# Patient Record
Sex: Male | Born: 1955
Health system: Southern US, Community
[De-identification: ages and names within clinical notes are randomized; demographics above are authoritative.]

## PROBLEM LIST (undated history)

## (undated) DIAGNOSIS — M1990 Unspecified osteoarthritis, unspecified site: Secondary | ICD-10-CM

## (undated) HISTORY — DX: Unspecified osteoarthritis, unspecified site: M19.90

## (undated) HISTORY — PX: ELBOW SURGERY: SHX618

---

## 2001-09-12 ENCOUNTER — Emergency Department (HOSPITAL_COMMUNITY): Admission: EM | Admit: 2001-09-12 | Discharge: 2001-09-13 | Payer: Self-pay | Admitting: *Deleted

## 2011-03-15 ENCOUNTER — Ambulatory Visit (INDEPENDENT_AMBULATORY_CARE_PROVIDER_SITE_OTHER): Payer: 59

## 2011-03-15 DIAGNOSIS — Z23 Encounter for immunization: Secondary | ICD-10-CM

## 2011-03-15 DIAGNOSIS — Z0000 Encounter for general adult medical examination without abnormal findings: Secondary | ICD-10-CM

## 2011-03-15 DIAGNOSIS — R7309 Other abnormal glucose: Secondary | ICD-10-CM

## 2012-12-08 ENCOUNTER — Ambulatory Visit (INDEPENDENT_AMBULATORY_CARE_PROVIDER_SITE_OTHER): Payer: BC Managed Care – PPO | Admitting: Internal Medicine

## 2012-12-08 ENCOUNTER — Ambulatory Visit: Payer: BC Managed Care – PPO

## 2012-12-08 VITALS — BP 130/90 | HR 70 | Temp 97.5°F | Resp 18 | Ht 72.0 in | Wt 292.0 lb

## 2012-12-08 DIAGNOSIS — Z0000 Encounter for general adult medical examination without abnormal findings: Secondary | ICD-10-CM

## 2012-12-08 DIAGNOSIS — M7989 Other specified soft tissue disorders: Secondary | ICD-10-CM

## 2012-12-08 DIAGNOSIS — M79641 Pain in right hand: Secondary | ICD-10-CM

## 2012-12-08 DIAGNOSIS — Z23 Encounter for immunization: Secondary | ICD-10-CM

## 2012-12-08 DIAGNOSIS — M79609 Pain in unspecified limb: Secondary | ICD-10-CM

## 2012-12-08 LAB — POCT UA - MICROSCOPIC ONLY
Bacteria, U Microscopic: NEGATIVE
Casts, Ur, LPF, POC: NEGATIVE
Crystals, Ur, HPF, POC: NEGATIVE
Epithelial cells, urine per micros: NEGATIVE
Mucus, UA: NEGATIVE
RBC, urine, microscopic: NEGATIVE
WBC, Ur, HPF, POC: NEGATIVE
Yeast, UA: NEGATIVE

## 2012-12-08 LAB — COMPREHENSIVE METABOLIC PANEL
ALT: 27 U/L (ref 0–53)
AST: 26 U/L (ref 0–37)
Albumin: 4.8 g/dL (ref 3.5–5.2)
Alkaline Phosphatase: 38 U/L — ABNORMAL LOW (ref 39–117)
BUN: 14 mg/dL (ref 6–23)
CO2: 28 mEq/L (ref 19–32)
Calcium: 9.5 mg/dL (ref 8.4–10.5)
Chloride: 105 mEq/L (ref 96–112)
Creat: 0.78 mg/dL (ref 0.50–1.35)
Glucose, Bld: 101 mg/dL — ABNORMAL HIGH (ref 70–99)
Potassium: 4.3 mEq/L (ref 3.5–5.3)
Sodium: 143 mEq/L (ref 135–145)
Total Bilirubin: 0.6 mg/dL (ref 0.3–1.2)
Total Protein: 7.6 g/dL (ref 6.0–8.3)

## 2012-12-08 LAB — TSH: TSH: 2.564 u[IU]/mL (ref 0.350–4.500)

## 2012-12-08 LAB — POCT URINALYSIS DIPSTICK
Bilirubin, UA: NEGATIVE
Blood, UA: NEGATIVE
Glucose, UA: NEGATIVE
Ketones, UA: NEGATIVE
Leukocytes, UA: NEGATIVE
Nitrite, UA: NEGATIVE
Protein, UA: NEGATIVE
Spec Grav, UA: >=1.03
Urobilinogen, UA: 0.2
pH, UA: 5

## 2012-12-08 LAB — POCT CBC
Granulocyte percent: 57.1 %G (ref 37–80)
HCT, POC: 45.1 % (ref 43.5–53.7)
Hemoglobin: 14.7 g/dL (ref 14.1–18.1)
Lymph, poc: 2.5 (ref 0.6–3.4)
MCH, POC: 32.6 pg — AB (ref 27–31.2)
MCHC: 32.6 g/dL (ref 31.8–35.4)
MCV: 99.9 fL — AB (ref 80–97)
MID (cbc): 0.6 (ref 0–0.9)
MPV: 8.4 fL (ref 0–99.8)
POC Granulocyte: 4.1 (ref 2–6.9)
POC LYMPH PERCENT: 34.5 %L (ref 10–50)
POC MID %: 8.4 %M (ref 0–12)
Platelet Count, POC: 263 10*3/uL (ref 142–424)
RBC: 4.51 M/uL — AB (ref 4.69–6.13)
RDW, POC: 13.1 %
WBC: 7.2 10*3/uL (ref 4.6–10.2)

## 2012-12-08 LAB — GLUCOSE, POCT (MANUAL RESULT ENTRY): POC Glucose: 94 mg/dl (ref 70–99)

## 2012-12-08 LAB — URIC ACID: Uric Acid, Serum: 5.6 mg/dL (ref 4.0–7.8)

## 2012-12-08 LAB — POCT GLYCOSYLATED HEMOGLOBIN (HGB A1C): Hemoglobin A1C: 5.4

## 2012-12-08 LAB — PSA: PSA: 0.49 ng/mL (ref <=4.00)

## 2012-12-08 LAB — LIPID PANEL
Cholesterol: 202 mg/dL — ABNORMAL HIGH (ref 0–200)
HDL: 43 mg/dL (ref >39)
LDL Cholesterol: 122 mg/dL — ABNORMAL HIGH (ref 0–99)
Total CHOL/HDL Ratio: 4.7 Ratio
Triglycerides: 185 mg/dL — ABNORMAL HIGH (ref <150)
VLDL: 37 mg/dL (ref 0–40)

## 2012-12-08 MED ORDER — IBUPROFEN 600 MG PO TABS: 600.0000 mg | ORAL_TABLET | Freq: Three times a day (TID) | ORAL | Status: DC | PRN

## 2012-12-08 NOTE — Progress Notes (Signed)
Subjective:    Patient ID: William Leach, male    DOB: 09-Jun-1955, 57 y.o.   MRN: [id number]  HPI Wants CPE, only co tender swollen right hand at 3rd mcpj.  No red or warm. Continues to gain weight and does not exercise. No smoke Hx of normal cardiac stress test and colonoscopy inless than 5 years Review of Systems  Constitutional: Negative.   HENT: Negative.   Eyes: Negative.   Respiratory: Negative.   Cardiovascular: Negative.   Gastrointestinal: Negative.   Endocrine: Negative.   Musculoskeletal: Positive for arthralgias.  Skin: Negative.   Allergic/Immunologic: Negative.   Neurological: Negative.   Hematological: Negative.   Psychiatric/Behavioral: Negative.        Objective:   Physical Exam  Vitals reviewed. Constitutional: He is oriented to person, place, and time. He appears well-developed and well-nourished. No distress.  HENT:  Head: Normocephalic.  Right Ear: External ear normal.  Left Ear: External ear normal.  Nose: Nose normal.  Mouth/Throat: Oropharynx is clear and moist.  Eyes: Conjunctivae and EOM are normal. Pupils are equal, round, and reactive to light. No scleral icterus.  Neck: Normal range of motion. Neck supple. No tracheal deviation present. No thyromegaly present.  Cardiovascular: Normal rate, regular rhythm, normal heart sounds and intact distal pulses.   No murmur heard. Pulmonary/Chest: Effort normal and breath sounds normal.  Abdominal: Soft. Bowel sounds are normal. He exhibits no distension and no mass. There is no tenderness.  Genitourinary: Rectum normal, prostate normal and penis normal.  Musculoskeletal: He exhibits edema and tenderness.  Lymphadenopathy:    He has no cervical adenopathy.  Neurological: He is alert and oriented to person, place, and time. He has normal reflexes. No cranial nerve deficit. He exhibits normal muscle tone. Coordination normal.  Skin: No rash noted. No erythema.  Psychiatric: He has a normal mood and  affect. His behavior is normal. Judgment and thought content normal.   [facility] reading (PRIMARY) by  Dr.Guest cystic djd of mc head, cxr normal EKG ok Results for orders placed in visit on 12/08/12  POCT CBC      Result Value Range   WBC 7.2  4.6 - 10.2 K/uL   Lymph, poc 2.5  0.6 - 3.4   POC LYMPH PERCENT 34.5  10 - 50 %L   MID (cbc) 0.6  0 - 0.9   POC MID % 8.4  0 - 12 %M   POC Granulocyte 4.1  2 - 6.9   Granulocyte percent 57.1  37 - 80 %G   RBC 4.51 (*) 4.69 - 6.13 M/uL   Hemoglobin 14.7  14.1 - 18.1 g/dL   HCT, POC 40.9  81.1 - 53.7 %   MCV 99.9 (*) 80 - 97 fL   MCH, POC 32.6 (*) 27 - 31.2 pg   MCHC 32.6  31.8 - 35.4 g/dL   RDW, POC 91.4     Platelet Count, POC 263  142 - 424 K/uL   MPV 8.4  0 - 99.8 fL  GLUCOSE, POCT (MANUAL RESULT ENTRY)      Result Value Range   POC Glucose 94  70 - 99 mg/dl  POCT GLYCOSYLATED HEMOGLOBIN (HGB A1C)      Result Value Range   Hemoglobin A1C 5.4    POCT URINALYSIS DIPSTICK      Result Value Range   Color, UA yellow     Clarity, UA clear     Glucose, UA neg     Bilirubin, UA neg  Ketones, UA neg     Spec Grav, UA >=1.030     Blood, UA neg     pH, UA 5.0     Protein, UA neg     Urobilinogen, UA 0.2     Nitrite, UA neg     Leukocytes, UA Negative    POCT UA - MICROSCOPIC ONLY      Result Value Range   WBC, Ur, HPF, POC neg     RBC, urine, microscopic neg     Bacteria, U Microscopic neg     Mucus, UA neg     Epithelial cells, urine per micros neg     Crystals, Ur, HPF, POC neg     Casts, Ur, LPF, POC neg     Yeast, UA neg             Assessment & Plan:  Exercise/Lose weight Pneumovax Uric acid level/Motrin 600mg  tid trial ro gout

## 2012-12-08 NOTE — Patient Instructions (Signed)
DASH Diet The DASH diet stands for "Dietary Approaches to Stop Hypertension." It is a healthy eating plan that has been shown to reduce high blood pressure (hypertension) in as little as 14 days, while also possibly providing other significant health benefits. These other health benefits include reducing the risk of breast cancer after menopause and reducing the risk of type 2 diabetes, heart disease, colon cancer, and stroke. Health benefits also include weight loss and slowing kidney failure in patients with chronic kidney disease.  DIET GUIDELINES  Limit salt (sodium). Your diet should contain less than 1500 mg of sodium daily.  Limit refined or processed carbohydrates. Your diet should include mostly whole grains. Desserts and added sugars should be used sparingly.  Include small amounts of heart-healthy fats. These types of fats include nuts, oils, and tub margarine. Limit saturated and trans fats. These fats have been shown to be harmful in the body. CHOOSING FOODS  The following food groups are based on a 2000 calorie diet. See your Registered Dietitian for individual calorie needs. Grains and Grain Products (6 to 8 servings daily)  Eat More Often: Whole-wheat bread, brown rice, whole-grain or wheat pasta, quinoa, popcorn without added fat or salt (air popped).  Eat Less Often: White bread, white pasta, white rice, cornbread. Vegetables (4 to 5 servings daily)  Eat More Often: Fresh, frozen, and canned vegetables. Vegetables may be raw, steamed, roasted, or grilled with a minimal amount of fat.  Eat Less Often/Avoid: Creamed or fried vegetables. Vegetables in a cheese sauce. Fruit (4 to 5 servings daily)  Eat More Often: All fresh, canned (in natural juice), or frozen fruits. Dried fruits without added sugar. One hundred percent fruit juice ( cup [237 mL] daily).  Eat Less Often: Dried fruits with added sugar. Canned fruit in light or heavy syrup. Lean Meats, Fish, and Poultry (2  servings or less daily. One serving is 3 to 4 oz [85-114 g]).  Eat More Often: Ninety percent or leaner ground beef, tenderloin, sirloin. Round cuts of beef, chicken breast, turkey breast. All fish. Grill, bake, or broil your meat. Nothing should be fried.  Eat Less Often/Avoid: Fatty cuts of meat, turkey, or chicken leg, thigh, or wing. Fried cuts of meat or fish. Dairy (2 to 3 servings)  Eat More Often: Low-fat or fat-free milk, low-fat plain or light yogurt, reduced-fat or part-skim cheese.  Eat Less Often/Avoid: Milk (whole, 2%).Whole milk yogurt. Full-fat cheeses. Nuts, Seeds, and Legumes (4 to 5 servings per week)  Eat More Often: All without added salt.  Eat Less Often/Avoid: Salted nuts and seeds, canned beans with added salt. Fats and Sweets (limited)  Eat More Often: Vegetable oils, tub margarines without trans fats, sugar-free gelatin. Mayonnaise and salad dressings.  Eat Less Often/Avoid: Coconut oils, palm oils, butter, stick margarine, cream, half and half, cookies, candy, pie. FOR MORE INFORMATION The Dash Diet Eating Plan: www.dashdiet.org Document Released: 03/14/2011 Document Revised: 06/17/2011 Document Reviewed: 03/14/2011 [employer] Patient Information 2014 [employer]. Calorie Counting Diet A calorie counting diet requires you to eat the number of calories that are right for you in a day. Calories are the measurement of how much energy you get from the food you eat. Eating the right amount of calories is important for staying at a healthy weight. If you eat too many calories, your body will store them as fat and you may gain weight. If you eat too few calories, you may lose weight. Counting the number of calories you eat during   a day will help you know if you are eating the right amount. A Registered Dietitian can determine how many calories you need in a day. The amount of calories needed varies from person to person. If your goal is to lose weight, you will need  to eat fewer calories. Losing weight can benefit you if you are overweight or have health problems such as heart disease, high blood pressure, or diabetes. If your goal is to gain weight, you will need to eat more calories. Gaining weight may be necessary if you have a certain health problem that causes your body to need more energy. TIPS Whether you are increasing or decreasing the number of calories you eat during a day, it may be hard to get used to changes in what you eat and drink. The following are tips to help you keep track of the number of calories you eat.  Measure foods at home with measuring cups. This helps you know the amount of food and number of calories you are eating.  Restaurants often serve food in amounts that are larger than 1 serving. While eating out, estimate how many servings of a food you are given. For example, a serving of cooked rice is  cup or about the size of half of a fist. Knowing serving sizes will help you be aware of how much food you are eating at restaurants.  Ask for smaller portion sizes or child-size portions at restaurants.  Plan to eat half of a meal at a restaurant. Take the rest home or share the other half with a friend.  Read the Nutrition Facts panel on food labels for calorie content and serving size. You can find out how many servings are in a package, the size of a serving, and the number of calories each serving has.  For example, a package might contain 3 cookies. The Nutrition Facts panel on that package says that 1 serving is 1 cookie. Below that, it will say there are 3 servings in the container. The calories section of the Nutrition Facts label says there are 90 calories. This means there are 90 calories in 1 cookie (1 serving). If you eat 1 cookie you have eaten 90 calories. If you eat all 3 cookies, you have eaten 270 calories (3 servings x 90 calories = 270 calories). The list below tells you how big or small some common portion sizes  are.  1 oz.........4 stacked dice.  3 oz.........Deck of cards.  1 tsp........Tip of little finger.  1 tbs........Thumb.  2 tbs........Golf ball.   cup.......Half of a fist.  1 cup........A fist. KEEP A FOOD LOG Write down every food item you eat, the amount you eat, and the number of calories in each food you eat during the day. At the end of the day, you can add up the total number of calories you have eaten. It may help to keep a list like the one below. Find out the calorie information by reading the Nutrition Facts panel on food labels. Breakfast  Bran cereal (1 cup, 110 calories).  Fat-free milk ( cup, 45 calories). Snack  Apple (1 medium, 80 calories). Lunch  Spinach (1 cup, 20 calories).  Tomato ( medium, 20 calories).  Chicken breast strips (3 oz, 165 calories).  Shredded cheddar cheese ( cup, 110 calories).  Light Italian dressing (2 tbs, 60 calories).  Whole-wheat bread (1 slice, 80 calories).  Tub margarine (1 tsp, 35 calories).  Vegetable soup (1 cup, 160 calories). Dinner    Pork chop (3 oz, 190 calories).  Brown rice (1 cup, 215 calories).  Steamed broccoli ( cup, 20 calories).  Strawberries (1  cup, 65 calories).  Whipped cream (1 tbs, 50 calories). Daily Calorie Total: 1425 Document Released: 03/25/2005 Document Revised: 06/17/2011 Document Reviewed: 09/19/2006 [employer] Patient Information 2014 [employer].  

## 2012-12-10 LAB — IFOBT (OCCULT BLOOD): IFOBT: NEGATIVE

## 2013-01-04 ENCOUNTER — Telehealth: Payer: Self-pay

## 2013-01-04 NOTE — Telephone Encounter (Signed)
Pt requests copy of his xrays on cd for an upcoming hand appt.   Please call when ready  Best: [phone]  bf

## 2015-01-25 ENCOUNTER — Ambulatory Visit (INDEPENDENT_AMBULATORY_CARE_PROVIDER_SITE_OTHER): Payer: BLUE CROSS/BLUE SHIELD | Admitting: Family Medicine

## 2015-01-25 ENCOUNTER — Ambulatory Visit (INDEPENDENT_AMBULATORY_CARE_PROVIDER_SITE_OTHER): Payer: BLUE CROSS/BLUE SHIELD

## 2015-01-25 VITALS — BP 138/90 | HR 80 | Temp 98.8°F | Resp 20 | Ht 71.5 in | Wt 293.1 lb

## 2015-01-25 DIAGNOSIS — S0990XA Unspecified injury of head, initial encounter: Secondary | ICD-10-CM | POA: Diagnosis not present

## 2015-01-25 DIAGNOSIS — S5002XA Contusion of left elbow, initial encounter: Secondary | ICD-10-CM | POA: Diagnosis not present

## 2015-01-25 DIAGNOSIS — M25512 Pain in left shoulder: Secondary | ICD-10-CM

## 2015-01-25 DIAGNOSIS — IMO0001 Reserved for inherently not codable concepts without codable children: Secondary | ICD-10-CM

## 2015-01-25 DIAGNOSIS — S298XXA Other specified injuries of thorax, initial encounter: Secondary | ICD-10-CM

## 2015-01-25 DIAGNOSIS — M25562 Pain in left knee: Secondary | ICD-10-CM

## 2015-01-25 DIAGNOSIS — R0781 Pleurodynia: Secondary | ICD-10-CM | POA: Diagnosis not present

## 2015-01-25 DIAGNOSIS — M25522 Pain in left elbow: Secondary | ICD-10-CM | POA: Diagnosis not present

## 2015-01-25 DIAGNOSIS — M79641 Pain in right hand: Secondary | ICD-10-CM

## 2015-01-25 DIAGNOSIS — S46912A Strain of unspecified muscle, fascia and tendon at shoulder and upper arm level, left arm, initial encounter: Secondary | ICD-10-CM | POA: Diagnosis not present

## 2015-01-25 DIAGNOSIS — S86812A Strain of other muscle(s) and tendon(s) at lower leg level, left leg, initial encounter: Secondary | ICD-10-CM

## 2015-01-25 DIAGNOSIS — R0789 Other chest pain: Secondary | ICD-10-CM

## 2015-01-25 DIAGNOSIS — M7989 Other specified soft tissue disorders: Secondary | ICD-10-CM | POA: Diagnosis not present

## 2015-01-25 DIAGNOSIS — S20211A Contusion of right front wall of thorax, initial encounter: Secondary | ICD-10-CM

## 2015-01-25 DIAGNOSIS — S86912A Strain of unspecified muscle(s) and tendon(s) at lower leg level, left leg, initial encounter: Secondary | ICD-10-CM

## 2015-01-25 LAB — POCT URINALYSIS DIP (MANUAL ENTRY)
Blood, UA: NEGATIVE
Glucose, UA: NEGATIVE
Leukocytes, UA: NEGATIVE
Nitrite, UA: NEGATIVE
Protein Ur, POC: 30 — AB
Spec Grav, UA: >=1.03
Urobilinogen, UA: 2
pH, UA: 5.5

## 2015-01-25 LAB — POC MICROSCOPIC URINALYSIS (UMFC)
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
## 2015-08-10 DIAGNOSIS — S22088A Other fracture of T11-T12 vertebra, initial encounter for closed fracture: Secondary | ICD-10-CM | POA: Diagnosis not present

## 2015-08-10 DIAGNOSIS — S22081A Stable burst fracture of T11-T12 vertebra, initial encounter for closed fracture: Secondary | ICD-10-CM | POA: Diagnosis not present

## 2015-08-10 DIAGNOSIS — S22071A Stable burst fracture of T9-T10 vertebra, initial encounter for closed fracture: Secondary | ICD-10-CM | POA: Diagnosis not present

## 2015-08-10 DIAGNOSIS — M2578 Osteophyte, vertebrae: Secondary | ICD-10-CM | POA: Diagnosis not present

## 2015-08-10 DIAGNOSIS — M47896 Other spondylosis, lumbar region: Secondary | ICD-10-CM | POA: Diagnosis not present

## 2015-08-10 DIAGNOSIS — R110 Nausea: Secondary | ICD-10-CM | POA: Diagnosis not present

## 2015-08-10 DIAGNOSIS — M546 Pain in thoracic spine: Secondary | ICD-10-CM | POA: Diagnosis not present

## 2015-08-10 DIAGNOSIS — Y9355 Activity, bike riding: Secondary | ICD-10-CM | POA: Diagnosis not present

## 2015-08-10 DIAGNOSIS — S3690XA Unspecified injury of unspecified intra-abdominal organ, initial encounter: Secondary | ICD-10-CM | POA: Diagnosis not present

## 2015-08-10 DIAGNOSIS — M1812 Unilateral primary osteoarthritis of first carpometacarpal joint, left hand: Secondary | ICD-10-CM | POA: Diagnosis not present

## 2015-08-10 DIAGNOSIS — Z041 Encounter for examination and observation following transport accident: Secondary | ICD-10-CM | POA: Diagnosis not present

## 2015-08-10 DIAGNOSIS — R079 Chest pain, unspecified: Secondary | ICD-10-CM | POA: Diagnosis not present

## 2015-08-10 DIAGNOSIS — R0602 Shortness of breath: Secondary | ICD-10-CM | POA: Diagnosis not present

## 2015-08-10 DIAGNOSIS — S3991XA Unspecified injury of abdomen, initial encounter: Secondary | ICD-10-CM | POA: Diagnosis not present

## 2015-08-10 DIAGNOSIS — S2020XA Contusion of thorax, unspecified, initial encounter: Secondary | ICD-10-CM | POA: Diagnosis not present

## 2015-08-10 DIAGNOSIS — S62512A Displaced fracture of proximal phalanx of left thumb, initial encounter for closed fracture: Secondary | ICD-10-CM | POA: Diagnosis not present

## 2015-08-10 DIAGNOSIS — S22089A Unspecified fracture of T11-T12 vertebra, initial encounter for closed fracture: Secondary | ICD-10-CM | POA: Diagnosis not present

## 2015-08-10 DIAGNOSIS — S2190XA Unspecified open wound of unspecified part of thorax, initial encounter: Secondary | ICD-10-CM | POA: Diagnosis not present

## 2015-08-10 DIAGNOSIS — S22080A Wedge compression fracture of T11-T12 vertebra, initial encounter for closed fracture: Secondary | ICD-10-CM | POA: Diagnosis not present

## 2015-08-10 DIAGNOSIS — S299XXA Unspecified injury of thorax, initial encounter: Secondary | ICD-10-CM | POA: Diagnosis not present

## 2015-08-10 DIAGNOSIS — S22078A Other fracture of T9-T10 vertebra, initial encounter for closed fracture: Secondary | ICD-10-CM | POA: Diagnosis not present

## 2015-08-10 DIAGNOSIS — M549 Dorsalgia, unspecified: Secondary | ICD-10-CM | POA: Diagnosis not present

## 2015-08-10 DIAGNOSIS — M4186 Other forms of scoliosis, lumbar region: Secondary | ICD-10-CM | POA: Diagnosis not present

## 2015-08-10 DIAGNOSIS — R109 Unspecified abdominal pain: Secondary | ICD-10-CM | POA: Diagnosis not present

## 2015-08-10 DIAGNOSIS — S22079A Unspecified fracture of T9-T10 vertebra, initial encounter for closed fracture: Secondary | ICD-10-CM | POA: Diagnosis not present

## 2015-08-11 DIAGNOSIS — S22078A Other fracture of T9-T10 vertebra, initial encounter for closed fracture: Secondary | ICD-10-CM | POA: Diagnosis not present

## 2015-08-11 DIAGNOSIS — S62512A Displaced fracture of proximal phalanx of left thumb, initial encounter for closed fracture: Secondary | ICD-10-CM | POA: Diagnosis not present

## 2015-08-11 DIAGNOSIS — S22088A Other fracture of T11-T12 vertebra, initial encounter for closed fracture: Secondary | ICD-10-CM | POA: Diagnosis not present

## 2015-08-11 DIAGNOSIS — M1812 Unilateral primary osteoarthritis of first carpometacarpal joint, left hand: Secondary | ICD-10-CM | POA: Diagnosis not present

## 2015-08-12 DIAGNOSIS — M47896 Other spondylosis, lumbar region: Secondary | ICD-10-CM | POA: Diagnosis not present

## 2015-08-12 DIAGNOSIS — S22071A Stable burst fracture of T9-T10 vertebra, initial encounter for closed fracture: Secondary | ICD-10-CM | POA: Diagnosis not present

## 2015-08-13 DIAGNOSIS — M2578 Osteophyte, vertebrae: Secondary | ICD-10-CM | POA: Diagnosis not present

## 2015-08-13 DIAGNOSIS — S22089A Unspecified fracture of T11-T12 vertebra, initial encounter for closed fracture: Secondary | ICD-10-CM | POA: Diagnosis not present

## 2015-08-13 DIAGNOSIS — S22071A Stable burst fracture of T9-T10 vertebra, initial encounter for closed fracture: Secondary | ICD-10-CM | POA: Diagnosis not present

## 2015-08-13 DIAGNOSIS — M4186 Other forms of scoliosis, lumbar region: Secondary | ICD-10-CM | POA: Diagnosis not present

## 2015-08-28 DIAGNOSIS — S22000D Wedge compression fracture of unspecified thoracic vertebra, subsequent encounter for fracture with routine healing: Secondary | ICD-10-CM | POA: Diagnosis not present

## 2015-08-28 DIAGNOSIS — S22080D Wedge compression fracture of T11-T12 vertebra, subsequent encounter for fracture with routine healing: Secondary | ICD-10-CM | POA: Diagnosis not present

## 2015-08-28 DIAGNOSIS — M5136 Other intervertebral disc degeneration, lumbar region: Secondary | ICD-10-CM | POA: Diagnosis not present

## 2015-08-28 DIAGNOSIS — S22070D Wedge compression fracture of T9-T10 vertebra, subsequent encounter for fracture with routine healing: Secondary | ICD-10-CM | POA: Diagnosis not present

## 2015-08-28 DIAGNOSIS — M5135 Other intervertebral disc degeneration, thoracolumbar region: Secondary | ICD-10-CM | POA: Diagnosis not present

## 2015-09-26 DIAGNOSIS — M79642 Pain in left hand: Secondary | ICD-10-CM | POA: Diagnosis not present

## 2015-10-09 DIAGNOSIS — S22080A Wedge compression fracture of T11-T12 vertebra, initial encounter for closed fracture: Secondary | ICD-10-CM | POA: Diagnosis not present

## 2015-10-09 DIAGNOSIS — S22080D Wedge compression fracture of T11-T12 vertebra, subsequent encounter for fracture with routine healing: Secondary | ICD-10-CM | POA: Diagnosis not present

## 2015-10-09 DIAGNOSIS — M4186 Other forms of scoliosis, lumbar region: Secondary | ICD-10-CM | POA: Diagnosis not present

## 2015-10-09 DIAGNOSIS — M545 Low back pain: Secondary | ICD-10-CM | POA: Diagnosis not present

## 2015-10-09 DIAGNOSIS — S22079A Unspecified fracture of T9-T10 vertebra, initial encounter for closed fracture: Secondary | ICD-10-CM | POA: Diagnosis not present

## 2015-10-09 DIAGNOSIS — S22089D Unspecified fracture of T11-T12 vertebra, subsequent encounter for fracture with routine healing: Secondary | ICD-10-CM | POA: Diagnosis not present

## 2015-10-09 DIAGNOSIS — S22070D Wedge compression fracture of T9-T10 vertebra, subsequent encounter for fracture with routine healing: Secondary | ICD-10-CM | POA: Diagnosis not present

## 2015-10-11 ENCOUNTER — Ambulatory Visit (INDEPENDENT_AMBULATORY_CARE_PROVIDER_SITE_OTHER): Payer: BLUE CROSS/BLUE SHIELD | Admitting: Physician Assistant

## 2015-10-11 VITALS — BP 130/88 | HR 83 | Temp 97.9°F | Resp 18 | Ht 71.5 in | Wt 270.0 lb

## 2015-10-11 DIAGNOSIS — Z130 Encounter for screening for diseases of the blood and blood-forming organs and certain disorders involving the immune mechanism: Secondary | ICD-10-CM | POA: Diagnosis not present

## 2015-10-11 DIAGNOSIS — Z0000 Encounter for general adult medical examination without abnormal findings: Secondary | ICD-10-CM

## 2015-10-11 DIAGNOSIS — Z13228 Encounter for screening for other metabolic disorders: Secondary | ICD-10-CM

## 2015-10-11 DIAGNOSIS — Z125 Encounter for screening for malignant neoplasm of prostate: Secondary | ICD-10-CM | POA: Diagnosis not present

## 2015-10-11 DIAGNOSIS — Z13220 Encounter for screening for lipoid disorders: Secondary | ICD-10-CM

## 2015-10-11 DIAGNOSIS — Z1329 Encounter for screening for other suspected endocrine disorder: Secondary | ICD-10-CM

## 2015-10-11 DIAGNOSIS — Z1211 Encounter for screening for malignant neoplasm of colon: Secondary | ICD-10-CM | POA: Diagnosis not present

## 2015-10-11 LAB — POCT CBC
Granulocyte percent: 63.6 %G (ref 37–80)
HCT, POC: 40.1 % — AB (ref 43.5–53.7)
Hemoglobin: 14.3 g/dL (ref 14.1–18.1)
Lymph, poc: 2.2 (ref 0.6–3.4)
MCH, POC: 32.4 pg — AB (ref 27–31.2)
MCHC: 35.5 g/dL — AB (ref 31.8–35.4)
MCV: 91.2 fL (ref 80–97)
MID (cbc): 0.6 (ref 0–0.9)
MPV: 6.9 fL (ref 0–99.8)
POC Granulocyte: 5 (ref 2–6.9)
POC LYMPH PERCENT: 28.2 %L (ref 10–50)
POC MID %: 8.2 %M (ref 0–12)
Platelet Count, POC: 240 10*3/uL (ref 142–424)
RBC: 4.4 M/uL — AB (ref 4.69–6.13)
RDW, POC: 12.6 %
WBC: 7.8 10*3/uL (ref 4.6–10.2)

## 2015-10-11 LAB — COMPLETE METABOLIC PANEL WITH GFR
ALT: 18 U/L (ref 9–46)
AST: 19 U/L (ref 10–35)
Albumin: 4.5 g/dL (ref 3.6–5.1)
Alkaline Phosphatase: 48 U/L (ref 40–115)
BUN: 13 mg/dL (ref 7–25)
CO2: 26 mmol/L (ref 20–31)
Calcium: 9.4 mg/dL (ref 8.6–10.3)
Chloride: 105 mmol/L (ref 98–110)
Creat: 0.71 mg/dL (ref 0.70–1.25)
GFR, Est African American: >89 mL/min (ref >=60)
GFR, Est Non African American: >89 mL/min (ref >=60)
Glucose, Bld: 106 mg/dL — ABNORMAL HIGH (ref 65–99)
Potassium: 4.4 mmol/L (ref 3.5–5.3)
Sodium: 141 mmol/L (ref 135–146)
Total Bilirubin: 0.9 mg/dL (ref 0.2–1.2)
Total Protein: 7.2 g/dL (ref 6.1–8.1)

## 2015-10-11 LAB — LIPID PANEL
Cholesterol: 188 mg/dL (ref 125–200)
HDL: 42 mg/dL (ref >=40)
LDL Cholesterol: 117 mg/dL (ref <130)
Total CHOL/HDL Ratio: 4.5 Ratio (ref <=5.0)
Triglycerides: 144 mg/dL (ref <150)
VLDL: 29 mg/dL (ref <30)

## 2015-10-11 LAB — TSH: TSH: 1.75 mIU/L (ref 0.40–4.50)
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
## 2015-10-11 NOTE — Patient Instructions (Addendum)
IF you received an x-ray today, you will receive an invoice from [facility]. Please contact [facility] at [phone] with questions or concerns regarding your invoice.   IF you received labwork today, you will receive an invoice from [employer]. Please contact [facility] at [phone] with questions or concerns regarding your invoice.   Our billing staff will not be able to assist you with questions regarding bills from these companies.  You will be contacted with the lab results as soon as they are available. The fastest way to get your results is to activate your My Chart account. Instructions are located on the last page of this paperwork. If you have not heard from [place] regarding the results in 2 weeks, please contact this office.   I will have your lab results in 7-10 days and we will go from there.    Keeping you healthy  Get these tests  Blood pressure- Have your blood pressure checked once a year by your healthcare provider.  Normal blood pressure is 120/80  Weight- Have your body mass index (BMI) calculated to screen for obesity.  BMI is a measure of body fat based on height and weight. You can also calculate your own BMI at ViewBanking.si.  Cholesterol- Have your cholesterol checked every year.  Diabetes- Have your blood sugar checked regularly if you have high blood pressure, high cholesterol, have a family history of diabetes or if you are overweight.  Screening for Colon Cancer- Colonoscopy starting at age 36.  Screening may begin sooner depending on your family history and other health conditions. Follow up colonoscopy as directed by your Gastroenterologist.  Screening for Prostate Cancer- Both blood work (PSA) and a rectal exam help screen for Prostate Cancer.  Screening begins at age 5 with African-American men and at age 25 with Caucasian men.  Screening may begin sooner depending on your family  history.  Take these medicines  Aspirin- One aspirin daily can help prevent Heart disease and Stroke.  Flu shot- Every fall.  Tetanus- Every 10 years.  Zostavax- Once after the age of 57 to prevent Shingles.  Pneumonia shot- Once after the age of 38; if you are younger than 71, ask your healthcare provider if you need a Pneumonia shot.  Take these steps  Don't smoke- If you do smoke, talk to your doctor about quitting.  For tips on how to quit, go to www.smokefree.gov or call 1-800-QUIT-NOW.  Be physically active- Exercise 5 days a week for at least 30 minutes.  If you are not already physically active start slow and gradually work up to 30 minutes of moderate physical activity.  Examples of moderate activity include walking briskly, mowing the yard, dancing, swimming, bicycling, etc.  Eat a healthy diet- Eat a variety of healthy food such as fruits, vegetables, low fat milk, low fat cheese, yogurt, lean meant, poultry, fish, beans, tofu, etc. For more information go to www.thenutritionsource.org  Drink alcohol in moderation- Limit alcohol intake to less than two drinks a day. Never drink and drive.  Dentist- Brush and floss twice daily; visit your dentist twice a year.  Depression- Your emotional health is as important as your physical health. If you're feeling down, or losing interest in things you would normally enjoy please talk to your healthcare provider.  Eye exam- Visit your eye doctor every year.  Safe sex- If you may be exposed to a sexually transmitted infection, use a condom.  Seat belts- Seat belts can  save your life; always wear one.  Smoke/Carbon Monoxide detectors- These detectors need to be installed on the appropriate level of your home.  Replace batteries at least once a year.  Skin cancer- When out in the sun, cover up and use sunscreen 15 SPF or higher.  Violence- If anyone is threatening you, please tell your healthcare provider.  Living Will/ Health care  power of attorney- Speak with your healthcare provider and family.

## 2015-10-12 LAB — PSA: PSA: 0.46 ng/mL (ref <=4.00)

## 2015-11-02 ENCOUNTER — Other Ambulatory Visit: Payer: Self-pay | Admitting: Gastroenterology

## 2015-11-02 DIAGNOSIS — M79642 Pain in left hand: Secondary | ICD-10-CM | POA: Diagnosis not present

## 2015-11-02 DIAGNOSIS — Z1211 Encounter for screening for malignant neoplasm of colon: Secondary | ICD-10-CM | POA: Diagnosis not present

## 2015-11-02 DIAGNOSIS — M25512 Pain in left shoulder: Secondary | ICD-10-CM | POA: Diagnosis not present

## 2015-11-02 DIAGNOSIS — M7989 Other specified soft tissue disorders: Secondary | ICD-10-CM

## 2015-11-02 DIAGNOSIS — M79661 Pain in right lower leg: Secondary | ICD-10-CM

## 2015-11-02 DIAGNOSIS — K5903 Drug induced constipation: Secondary | ICD-10-CM | POA: Diagnosis not present

## 2015-11-07 ENCOUNTER — Ambulatory Visit
Admission: RE | Admit: 2015-11-07 | Discharge: 2015-11-07 | Disposition: A | Discharge status: Home / Self Care | Payer: BLUE CROSS/BLUE SHIELD | Source: Ambulatory Visit | Attending: Gastroenterology | Admitting: Gastroenterology

## 2015-11-07 DIAGNOSIS — M7989 Other specified soft tissue disorders: Secondary | ICD-10-CM | POA: Diagnosis not present

## 2015-11-09 DIAGNOSIS — M25512 Pain in left shoulder: Secondary | ICD-10-CM | POA: Diagnosis not present

## 2015-11-10 ENCOUNTER — Telehealth: Payer: Self-pay | Admitting: Urgent Care

## 2015-11-10 NOTE — Telephone Encounter (Signed)
Reported his ultrasound results were negative for DVT. Advised that he follow up with PA-Stephanie for further work up. Patient requested a copy/review of his labs from his annual physical. I forward this to PA-Stephanie.

## 2015-11-17 ENCOUNTER — Other Ambulatory Visit: Payer: Self-pay | Admitting: Physician Assistant

## 2015-11-17 MED ORDER — SILDENAFIL CITRATE 25 MG PO TABS: 25.0000 mg | ORAL_TABLET | Freq: Every day | ORAL | 3 refills | Status: DC | PRN

## 2015-11-17 NOTE — Telephone Encounter (Signed)
Order and lab instructions and information placed and sent to lab pool to relay to patient.

## 2015-11-17 NOTE — Progress Notes (Signed)
Informed the pt about his results

## 2015-11-17 NOTE — Progress Notes (Signed)
Please contact patient.  He will also want a letter.  Patient's labs look good.  Cbc was unremarkable.  Your lipid panel is completely normal.  Kidney and liver function are normal.  Thyroid function is normal. Normal psa which displays no concern for prostate.  I have placed medication as discussed with 3 refills.  Return sooner if there is an issue, and/or let me know if any side effects.

## 2015-11-23 DIAGNOSIS — M79642 Pain in left hand: Secondary | ICD-10-CM | POA: Diagnosis not present

## 2015-11-23 DIAGNOSIS — M25512 Pain in left shoulder: Secondary | ICD-10-CM | POA: Diagnosis not present

## 2015-12-04 ENCOUNTER — Ambulatory Visit (INDEPENDENT_AMBULATORY_CARE_PROVIDER_SITE_OTHER): Payer: BLUE CROSS/BLUE SHIELD | Admitting: Physician Assistant

## 2015-12-04 VITALS — BP 136/80 | HR 78 | Temp 98.5°F | Resp 16 | Ht 71.5 in | Wt 267.8 lb

## 2015-12-04 DIAGNOSIS — R600 Localized edema: Secondary | ICD-10-CM | POA: Diagnosis not present

## 2015-12-04 NOTE — Patient Instructions (Addendum)
Please await contact for the referral to vein and vascular at this time. I would also like you to start wearing the compression stockings as we discussed. Picture which are elevating the leg at bedtime. I would also like to check and see if Dr. Yves Dill would like you to be referred to a physical therapist. Please away that phone call back. If you do not receive phone call about renovascular within a week I would like you to contact me.    IF you received an x-ray today, you will receive an invoice from [facility]. Please contact [facility] at [phone] with questions or concerns regarding your invoice.   IF you received labwork today, you will receive an invoice from [employer]. Please contact [facility] at [phone] with questions or concerns regarding your invoice.   Our billing staff will not be able to assist you with questions regarding bills from these companies.  You will be contacted with the lab results as soon as they are available. The fastest way to get your results is to activate your My Chart account. Instructions are located on the last page of this paperwork. If you have not heard from [place] regarding the results in 2 weeks, please contact this office.
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
## 2015-12-07 DIAGNOSIS — M25512 Pain in left shoulder: Secondary | ICD-10-CM | POA: Diagnosis not present

## 2015-12-08 ENCOUNTER — Other Ambulatory Visit: Payer: Self-pay | Admitting: Vascular Surgery

## 2015-12-08 DIAGNOSIS — M7989 Other specified soft tissue disorders: Secondary | ICD-10-CM

## 2015-12-25 DIAGNOSIS — S22080D Wedge compression fracture of T11-T12 vertebra, subsequent encounter for fracture with routine healing: Secondary | ICD-10-CM | POA: Diagnosis not present

## 2015-12-25 DIAGNOSIS — S22089D Unspecified fracture of T11-T12 vertebra, subsequent encounter for fracture with routine healing: Secondary | ICD-10-CM | POA: Diagnosis not present

## 2015-12-25 DIAGNOSIS — Z87891 Personal history of nicotine dependence: Secondary | ICD-10-CM | POA: Diagnosis not present

## 2015-12-29 DIAGNOSIS — K5730 Diverticulosis of large intestine without perforation or abscess without bleeding: Secondary | ICD-10-CM | POA: Diagnosis not present

## 2015-12-29 DIAGNOSIS — D120 Benign neoplasm of cecum: Secondary | ICD-10-CM | POA: Diagnosis not present

## 2015-12-29 DIAGNOSIS — D125 Benign neoplasm of sigmoid colon: Secondary | ICD-10-CM | POA: Diagnosis not present

## 2015-12-29 DIAGNOSIS — K635 Polyp of colon: Secondary | ICD-10-CM | POA: Diagnosis not present

## 2015-12-29 DIAGNOSIS — Z1211 Encounter for screening for malignant neoplasm of colon: Secondary | ICD-10-CM | POA: Diagnosis not present

## 2016-01-09 ENCOUNTER — Encounter: Payer: Self-pay | Admitting: Vascular Surgery

## 2016-01-12 ENCOUNTER — Ambulatory Visit (INDEPENDENT_AMBULATORY_CARE_PROVIDER_SITE_OTHER): Payer: BLUE CROSS/BLUE SHIELD | Admitting: Vascular Surgery

## 2016-01-12 ENCOUNTER — Ambulatory Visit (HOSPITAL_COMMUNITY)
Admission: RE | Admit: 2016-01-12 | Discharge: 2016-01-12 | Disposition: A | Discharge status: Home / Self Care | Payer: BLUE CROSS/BLUE SHIELD | Source: Ambulatory Visit | Attending: Vascular Surgery | Admitting: Vascular Surgery

## 2016-01-12 ENCOUNTER — Encounter: Payer: Self-pay | Admitting: Vascular Surgery

## 2016-01-12 VITALS — BP 145/95 | HR 74 | Temp 99.5°F | Resp 16 | Ht 71.5 in | Wt 262.0 lb

## 2016-01-12 DIAGNOSIS — I83893 Varicose veins of bilateral lower extremities with other complications: Secondary | ICD-10-CM | POA: Diagnosis not present

## 2016-01-12 DIAGNOSIS — M7989 Other specified soft tissue disorders: Secondary | ICD-10-CM

## 2016-01-12 DIAGNOSIS — I872 Venous insufficiency (chronic) (peripheral): Secondary | ICD-10-CM
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
## 2016-01-19 DIAGNOSIS — M75120 Complete rotator cuff tear or rupture of unspecified shoulder, not specified as traumatic: Secondary | ICD-10-CM | POA: Diagnosis not present

## 2016-01-19 DIAGNOSIS — G8918 Other acute postprocedural pain: Secondary | ICD-10-CM | POA: Diagnosis not present

## 2016-01-19 DIAGNOSIS — M75122 Complete rotator cuff tear or rupture of left shoulder, not specified as traumatic: Secondary | ICD-10-CM | POA: Diagnosis not present

## 2016-01-19 DIAGNOSIS — M7542 Impingement syndrome of left shoulder: Secondary | ICD-10-CM | POA: Diagnosis not present

## 2016-01-19 DIAGNOSIS — M24112 Other articular cartilage disorders, left shoulder: Secondary | ICD-10-CM | POA: Diagnosis not present

## 2016-01-19 DIAGNOSIS — M7552 Bursitis of left shoulder: Secondary | ICD-10-CM | POA: Diagnosis not present

## 2016-01-22 DIAGNOSIS — M75122 Complete rotator cuff tear or rupture of left shoulder, not specified as traumatic: Secondary | ICD-10-CM | POA: Diagnosis not present

## 2016-01-22 DIAGNOSIS — M25512 Pain in left shoulder: Secondary | ICD-10-CM | POA: Diagnosis not present

## 2016-01-22 DIAGNOSIS — M25612 Stiffness of left shoulder, not elsewhere classified: Secondary | ICD-10-CM | POA: Diagnosis not present

## 2016-01-25 DIAGNOSIS — M25612 Stiffness of left shoulder, not elsewhere classified: Secondary | ICD-10-CM | POA: Diagnosis not present

## 2016-01-25 DIAGNOSIS — M75122 Complete rotator cuff tear or rupture of left shoulder, not specified as traumatic: Secondary | ICD-10-CM | POA: Diagnosis not present

## 2016-01-25 DIAGNOSIS — M25512 Pain in left shoulder: Secondary | ICD-10-CM | POA: Diagnosis not present

## 2016-01-26 ENCOUNTER — Encounter: Payer: Self-pay | Admitting: Physician Assistant

## 2016-01-26 DIAGNOSIS — D126 Benign neoplasm of colon, unspecified: Secondary | ICD-10-CM

## 2016-01-26 HISTORY — DX: Benign neoplasm of colon, unspecified: D12.6

## 2016-01-30 DIAGNOSIS — M75122 Complete rotator cuff tear or rupture of left shoulder, not specified as traumatic: Secondary | ICD-10-CM | POA: Diagnosis not present

## 2016-01-30 DIAGNOSIS — M25612 Stiffness of left shoulder, not elsewhere classified: Secondary | ICD-10-CM | POA: Diagnosis not present

## 2016-01-30 DIAGNOSIS — M25512 Pain in left shoulder: Secondary | ICD-10-CM | POA: Diagnosis not present

## 2016-02-02 DIAGNOSIS — M25612 Stiffness of left shoulder, not elsewhere classified: Secondary | ICD-10-CM | POA: Diagnosis not present

## 2016-02-02 DIAGNOSIS — M75122 Complete rotator cuff tear or rupture of left shoulder, not specified as traumatic: Secondary | ICD-10-CM | POA: Diagnosis not present

## 2016-02-02 DIAGNOSIS — M25512 Pain in left shoulder: Secondary | ICD-10-CM | POA: Diagnosis not present

## 2016-02-06 DIAGNOSIS — M75122 Complete rotator cuff tear or rupture of left shoulder, not specified as traumatic: Secondary | ICD-10-CM | POA: Diagnosis not present

## 2016-02-06 DIAGNOSIS — M25612 Stiffness of left shoulder, not elsewhere classified: Secondary | ICD-10-CM | POA: Diagnosis not present

## 2016-02-06 DIAGNOSIS — M25512 Pain in left shoulder: Secondary | ICD-10-CM | POA: Diagnosis not present

## 2016-02-09 DIAGNOSIS — M75122 Complete rotator cuff tear or rupture of left shoulder, not specified as traumatic: Secondary | ICD-10-CM | POA: Diagnosis not present

## 2016-02-09 DIAGNOSIS — M25612 Stiffness of left shoulder, not elsewhere classified: Secondary | ICD-10-CM | POA: Diagnosis not present

## 2016-02-09 DIAGNOSIS — M25512 Pain in left shoulder: Secondary | ICD-10-CM | POA: Diagnosis not present

## 2016-02-13 DIAGNOSIS — M75122 Complete rotator cuff tear or rupture of left shoulder, not specified as traumatic: Secondary | ICD-10-CM | POA: Diagnosis not present

## 2016-02-13 DIAGNOSIS — M25512 Pain in left shoulder: Secondary | ICD-10-CM | POA: Diagnosis not present

## 2016-02-13 DIAGNOSIS — M25612 Stiffness of left shoulder, not elsewhere classified: Secondary | ICD-10-CM | POA: Diagnosis not present

## 2016-02-15 DIAGNOSIS — M25512 Pain in left shoulder: Secondary | ICD-10-CM | POA: Diagnosis not present

## 2016-02-15 DIAGNOSIS — M25612 Stiffness of left shoulder, not elsewhere classified: Secondary | ICD-10-CM | POA: Diagnosis not present

## 2016-02-15 DIAGNOSIS — M75122 Complete rotator cuff tear or rupture of left shoulder, not specified as traumatic: Secondary | ICD-10-CM | POA: Diagnosis not present

## 2016-02-19 ENCOUNTER — Encounter: Payer: Self-pay | Admitting: Physician Assistant

## 2016-02-20 DIAGNOSIS — M25612 Stiffness of left shoulder, not elsewhere classified: Secondary | ICD-10-CM | POA: Diagnosis not present

## 2016-02-20 DIAGNOSIS — M25512 Pain in left shoulder: Secondary | ICD-10-CM | POA: Diagnosis not present

## 2016-02-20 DIAGNOSIS — M75122 Complete rotator cuff tear or rupture of left shoulder, not specified as traumatic: Secondary | ICD-10-CM | POA: Diagnosis not present

## 2016-02-22 DIAGNOSIS — M75122 Complete rotator cuff tear or rupture of left shoulder, not specified as traumatic: Secondary | ICD-10-CM | POA: Diagnosis not present

## 2016-02-22 DIAGNOSIS — M25512 Pain in left shoulder: Secondary | ICD-10-CM | POA: Diagnosis not present

## 2016-02-22 DIAGNOSIS — M25612 Stiffness of left shoulder, not elsewhere classified: Secondary | ICD-10-CM | POA: Diagnosis not present

## 2016-02-26 DIAGNOSIS — M75122 Complete rotator cuff tear or rupture of left shoulder, not specified as traumatic: Secondary | ICD-10-CM | POA: Diagnosis not present

## 2016-02-26 DIAGNOSIS — M25612 Stiffness of left shoulder, not elsewhere classified: Secondary | ICD-10-CM | POA: Diagnosis not present

## 2016-02-26 DIAGNOSIS — M25512 Pain in left shoulder: Secondary | ICD-10-CM | POA: Diagnosis not present

## 2016-02-27 DIAGNOSIS — M75122 Complete rotator cuff tear or rupture of left shoulder, not specified as traumatic: Secondary | ICD-10-CM | POA: Diagnosis not present

## 2016-02-27 DIAGNOSIS — M25512 Pain in left shoulder: Secondary | ICD-10-CM | POA: Diagnosis not present

## 2016-02-27 DIAGNOSIS — M25612 Stiffness of left shoulder, not elsewhere classified: Secondary | ICD-10-CM | POA: Diagnosis not present

## 2016-03-05 DIAGNOSIS — M75122 Complete rotator cuff tear or rupture of left shoulder, not specified as traumatic: Secondary | ICD-10-CM | POA: Diagnosis not present

## 2016-03-05 DIAGNOSIS — M25612 Stiffness of left shoulder, not elsewhere classified: Secondary | ICD-10-CM | POA: Diagnosis not present

## 2016-03-05 DIAGNOSIS — M25512 Pain in left shoulder: Secondary | ICD-10-CM | POA: Diagnosis not present

## 2016-03-06 DIAGNOSIS — M25612 Stiffness of left shoulder, not elsewhere classified: Secondary | ICD-10-CM | POA: Diagnosis not present

## 2016-03-06 DIAGNOSIS — M75122 Complete rotator cuff tear or rupture of left shoulder, not specified as traumatic: Secondary | ICD-10-CM | POA: Diagnosis not present

## 2016-03-06 DIAGNOSIS — M25512 Pain in left shoulder: Secondary | ICD-10-CM | POA: Diagnosis not present

## 2016-03-12 DIAGNOSIS — M25612 Stiffness of left shoulder, not elsewhere classified: Secondary | ICD-10-CM | POA: Diagnosis not present

## 2016-03-12 DIAGNOSIS — M75122 Complete rotator cuff tear or rupture of left shoulder, not specified as traumatic: Secondary | ICD-10-CM | POA: Diagnosis not present

## 2016-03-12 DIAGNOSIS — M25512 Pain in left shoulder: Secondary | ICD-10-CM | POA: Diagnosis not present

## 2016-03-14 DIAGNOSIS — M75122 Complete rotator cuff tear or rupture of left shoulder, not specified as traumatic: Secondary | ICD-10-CM | POA: Diagnosis not present

## 2016-03-14 DIAGNOSIS — M25612 Stiffness of left shoulder, not elsewhere classified: Secondary | ICD-10-CM | POA: Diagnosis not present

## 2016-03-14 DIAGNOSIS — M25512 Pain in left shoulder: Secondary | ICD-10-CM | POA: Diagnosis not present

## 2016-03-19 DIAGNOSIS — M75122 Complete rotator cuff tear or rupture of left shoulder, not specified as traumatic: Secondary | ICD-10-CM | POA: Diagnosis not present

## 2016-03-19 DIAGNOSIS — M25512 Pain in left shoulder: Secondary | ICD-10-CM | POA: Diagnosis not present

## 2016-03-19 DIAGNOSIS — M25612 Stiffness of left shoulder, not elsewhere classified: Secondary | ICD-10-CM | POA: Diagnosis not present

## 2016-03-21 DIAGNOSIS — M25512 Pain in left shoulder: Secondary | ICD-10-CM | POA: Diagnosis not present

## 2016-03-21 DIAGNOSIS — M25612 Stiffness of left shoulder, not elsewhere classified: Secondary | ICD-10-CM | POA: Diagnosis not present

## 2016-03-21 DIAGNOSIS — M75122 Complete rotator cuff tear or rupture of left shoulder, not specified as traumatic: Secondary | ICD-10-CM | POA: Diagnosis not present

## 2016-03-26 DIAGNOSIS — M75122 Complete rotator cuff tear or rupture of left shoulder, not specified as traumatic: Secondary | ICD-10-CM | POA: Diagnosis not present

## 2016-03-26 DIAGNOSIS — M25612 Stiffness of left shoulder, not elsewhere classified: Secondary | ICD-10-CM | POA: Diagnosis not present

## 2016-03-26 DIAGNOSIS — M25512 Pain in left shoulder: Secondary | ICD-10-CM | POA: Diagnosis not present

## 2016-03-28 DIAGNOSIS — M25512 Pain in left shoulder: Secondary | ICD-10-CM | POA: Diagnosis not present

## 2016-03-28 DIAGNOSIS — M75122 Complete rotator cuff tear or rupture of left shoulder, not specified as traumatic: Secondary | ICD-10-CM | POA: Diagnosis not present

## 2016-03-28 DIAGNOSIS — M25612 Stiffness of left shoulder, not elsewhere classified: Secondary | ICD-10-CM | POA: Diagnosis not present

## 2016-04-09 DIAGNOSIS — M25512 Pain in left shoulder: Secondary | ICD-10-CM | POA: Diagnosis not present

## 2016-04-09 DIAGNOSIS — M75122 Complete rotator cuff tear or rupture of left shoulder, not specified as traumatic: Secondary | ICD-10-CM | POA: Diagnosis not present

## 2016-04-09 DIAGNOSIS — M25612 Stiffness of left shoulder, not elsewhere classified: Secondary | ICD-10-CM | POA: Diagnosis not present

## 2016-04-11 DIAGNOSIS — M25612 Stiffness of left shoulder, not elsewhere classified: Secondary | ICD-10-CM | POA: Diagnosis not present

## 2016-04-11 DIAGNOSIS — M75122 Complete rotator cuff tear or rupture of left shoulder, not specified as traumatic: Secondary | ICD-10-CM | POA: Diagnosis not present

## 2016-04-11 DIAGNOSIS — M25512 Pain in left shoulder: Secondary | ICD-10-CM | POA: Diagnosis not present

## 2016-04-16 DIAGNOSIS — M75122 Complete rotator cuff tear or rupture of left shoulder, not specified as traumatic: Secondary | ICD-10-CM | POA: Diagnosis not present

## 2016-04-16 DIAGNOSIS — M25612 Stiffness of left shoulder, not elsewhere classified: Secondary | ICD-10-CM | POA: Diagnosis not present

## 2016-04-16 DIAGNOSIS — M25512 Pain in left shoulder: Secondary | ICD-10-CM | POA: Diagnosis not present

## 2016-04-18 DIAGNOSIS — M25512 Pain in left shoulder: Secondary | ICD-10-CM | POA: Diagnosis not present

## 2016-04-18 DIAGNOSIS — M25612 Stiffness of left shoulder, not elsewhere classified: Secondary | ICD-10-CM | POA: Diagnosis not present

## 2016-04-18 DIAGNOSIS — M75122 Complete rotator cuff tear or rupture of left shoulder, not specified as traumatic: Secondary | ICD-10-CM | POA: Diagnosis not present

## 2016-04-23 DIAGNOSIS — M25612 Stiffness of left shoulder, not elsewhere classified: Secondary | ICD-10-CM | POA: Diagnosis not present

## 2016-04-23 DIAGNOSIS — M75122 Complete rotator cuff tear or rupture of left shoulder, not specified as traumatic: Secondary | ICD-10-CM | POA: Diagnosis not present

## 2016-04-23 DIAGNOSIS — M25512 Pain in left shoulder: Secondary | ICD-10-CM | POA: Diagnosis not present

## 2016-04-25 DIAGNOSIS — M25512 Pain in left shoulder: Secondary | ICD-10-CM | POA: Diagnosis not present

## 2016-04-29 DIAGNOSIS — M25512 Pain in left shoulder: Secondary | ICD-10-CM | POA: Diagnosis not present

## 2016-04-29 DIAGNOSIS — M75122 Complete rotator cuff tear or rupture of left shoulder, not specified as traumatic: Secondary | ICD-10-CM | POA: Diagnosis not present

## 2016-04-29 DIAGNOSIS — M25612 Stiffness of left shoulder, not elsewhere classified: Secondary | ICD-10-CM | POA: Diagnosis not present

## 2016-05-02 DIAGNOSIS — M75122 Complete rotator cuff tear or rupture of left shoulder, not specified as traumatic: Secondary | ICD-10-CM | POA: Diagnosis not present

## 2016-05-02 DIAGNOSIS — M25612 Stiffness of left shoulder, not elsewhere classified: Secondary | ICD-10-CM | POA: Diagnosis not present

## 2016-05-02 DIAGNOSIS — M25512 Pain in left shoulder: Secondary | ICD-10-CM | POA: Diagnosis not present

## 2016-05-10 DIAGNOSIS — M25512 Pain in left shoulder: Secondary | ICD-10-CM | POA: Diagnosis not present

## 2016-05-10 DIAGNOSIS — M75122 Complete rotator cuff tear or rupture of left shoulder, not specified as traumatic: Secondary | ICD-10-CM | POA: Diagnosis not present

## 2016-05-10 DIAGNOSIS — M25612 Stiffness of left shoulder, not elsewhere classified: Secondary | ICD-10-CM | POA: Diagnosis not present

## 2016-05-14 DIAGNOSIS — M75122 Complete rotator cuff tear or rupture of left shoulder, not specified as traumatic: Secondary | ICD-10-CM | POA: Diagnosis not present

## 2016-05-14 DIAGNOSIS — M25512 Pain in left shoulder: Secondary | ICD-10-CM | POA: Diagnosis not present

## 2016-05-14 DIAGNOSIS — M25612 Stiffness of left shoulder, not elsewhere classified: Secondary | ICD-10-CM | POA: Diagnosis not present

## 2016-05-23 DIAGNOSIS — M25512 Pain in left shoulder: Secondary | ICD-10-CM | POA: Diagnosis not present

## 2016-12-31 DIAGNOSIS — H0013 Chalazion right eye, unspecified eyelid: Secondary | ICD-10-CM | POA: Diagnosis not present

## 2016-12-31 DIAGNOSIS — H0012 Chalazion right lower eyelid: Secondary | ICD-10-CM | POA: Diagnosis not present

## 2017-01-09 DIAGNOSIS — L814 Other melanin hyperpigmentation: Secondary | ICD-10-CM | POA: Diagnosis not present

## 2017-01-09 DIAGNOSIS — D229 Melanocytic nevi, unspecified: Secondary | ICD-10-CM | POA: Diagnosis not present

## 2017-01-09 DIAGNOSIS — C44519 Basal cell carcinoma of skin of other part of trunk: Secondary | ICD-10-CM | POA: Diagnosis not present

## 2017-01-09 DIAGNOSIS — L570 Actinic keratosis: Secondary | ICD-10-CM | POA: Diagnosis not present

## 2017-01-09 DIAGNOSIS — D485 Neoplasm of uncertain behavior of skin: Secondary | ICD-10-CM | POA: Diagnosis not present

## 2017-01-09 DIAGNOSIS — L821 Other seborrheic keratosis: Secondary | ICD-10-CM | POA: Diagnosis not present

## 2017-01-22 DIAGNOSIS — H40003 Preglaucoma, unspecified, bilateral: Secondary | ICD-10-CM | POA: Diagnosis not present

## 2017-03-26 DIAGNOSIS — C44519 Basal cell carcinoma of skin of other part of trunk: Secondary | ICD-10-CM | POA: Diagnosis not present

## 2017-05-22 IMAGING — US US EXTREM LOW VENOUS*R*
1 series · 14 of 24 positions shown · non-contrast
Comparison: None

CLINICAL DATA: Right leg swelling

EXAM:
RIGHT LOWER EXTREMITY VENOUS DOPPLER ULTRASOUND
TECHNIQUE: Gray-scale sonography with compression, as well as color and duplex
ultrasound, were performed to evaluate the deep venous system from
the level of the common femoral vein through the popliteal and
proximal calf veins.

[Series 1: us extrem low venous*right* · 14 of 33 slices shown]
[im 1/33]
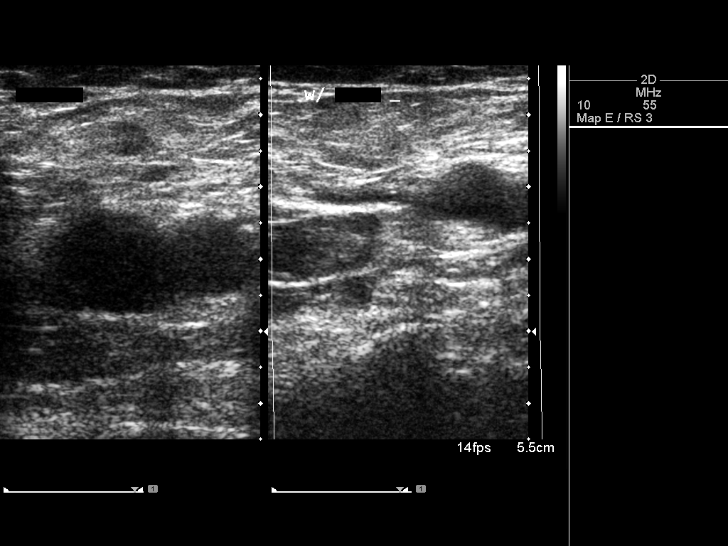
[im 3/33]
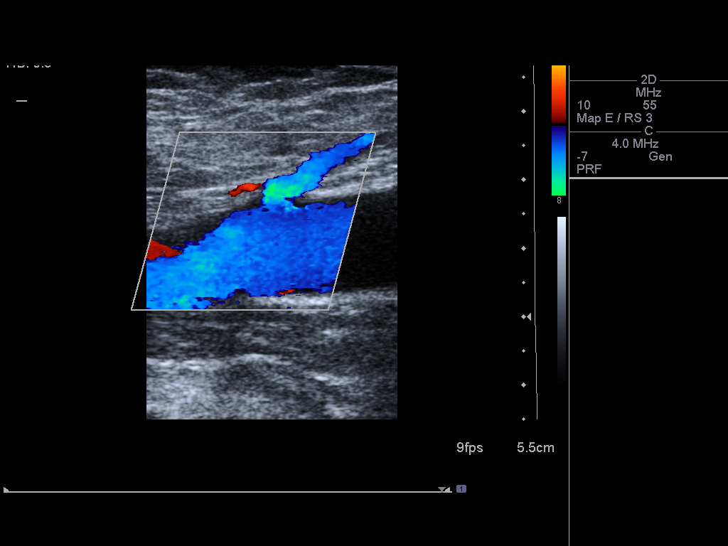
[im 6/33]
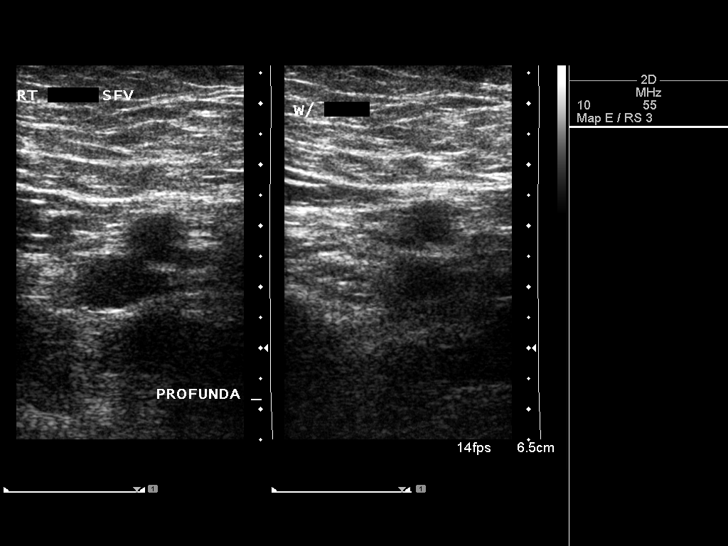
[im 9/33]
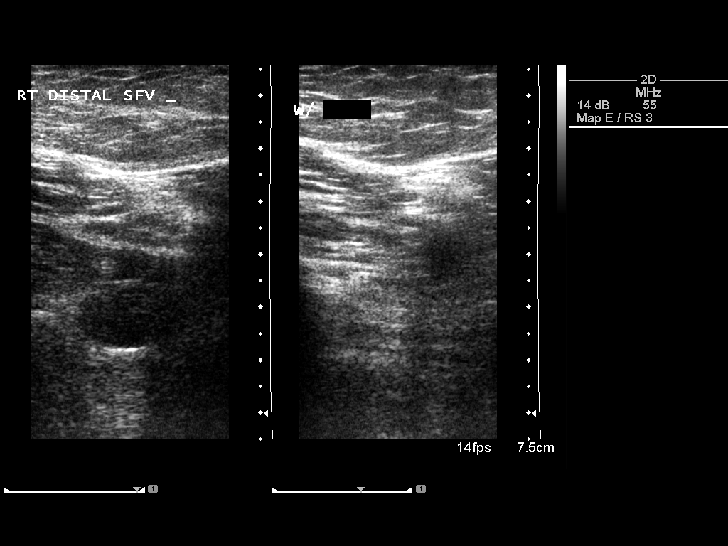
[im 10/33]
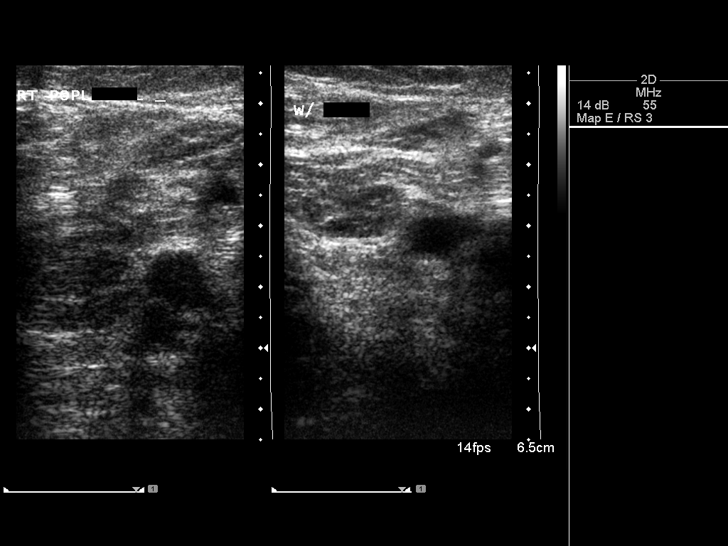
[im 13/33]
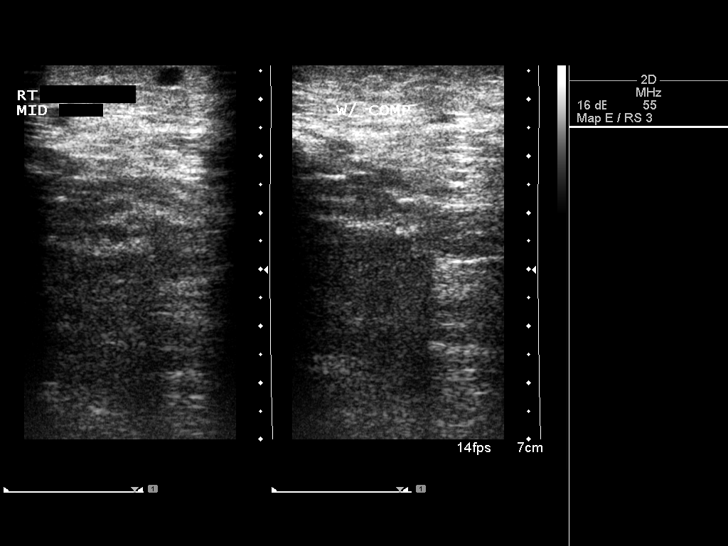
[im 16/33]
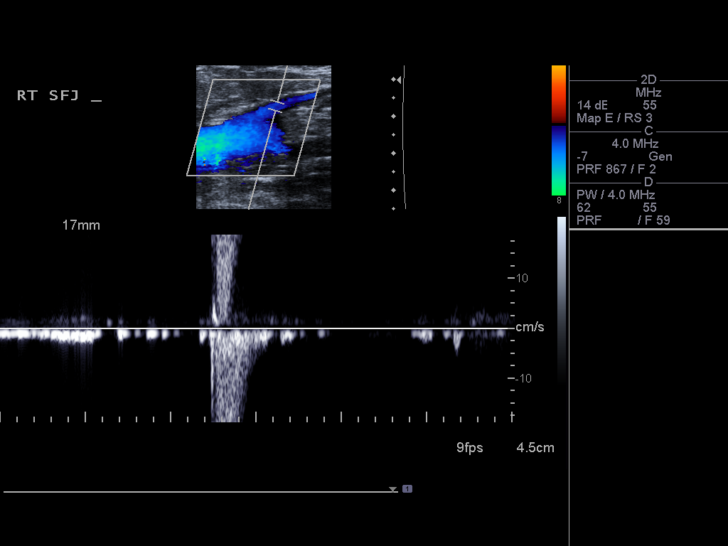
[im 17/33]
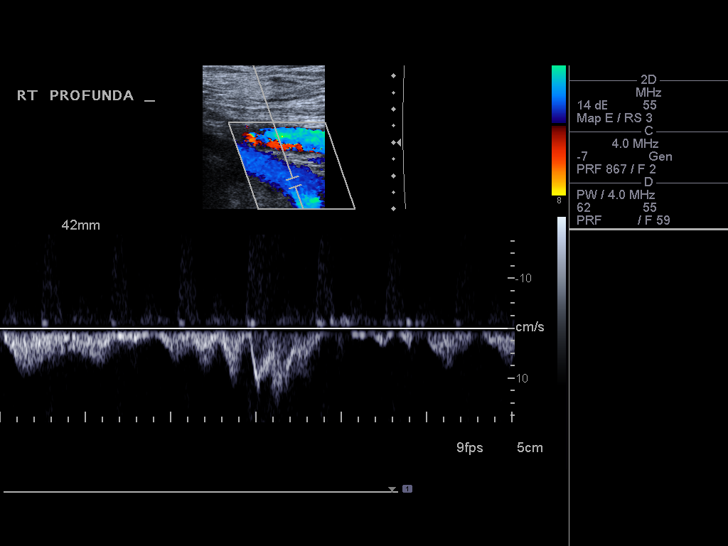
[im 20/33]
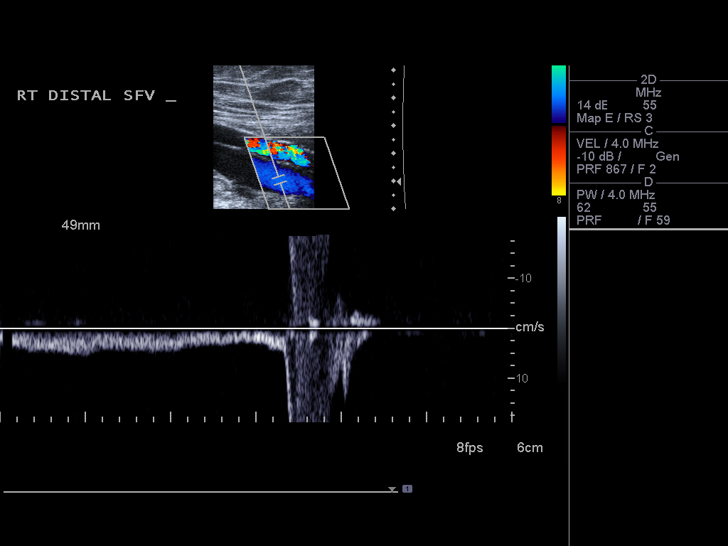
[im 23/33]
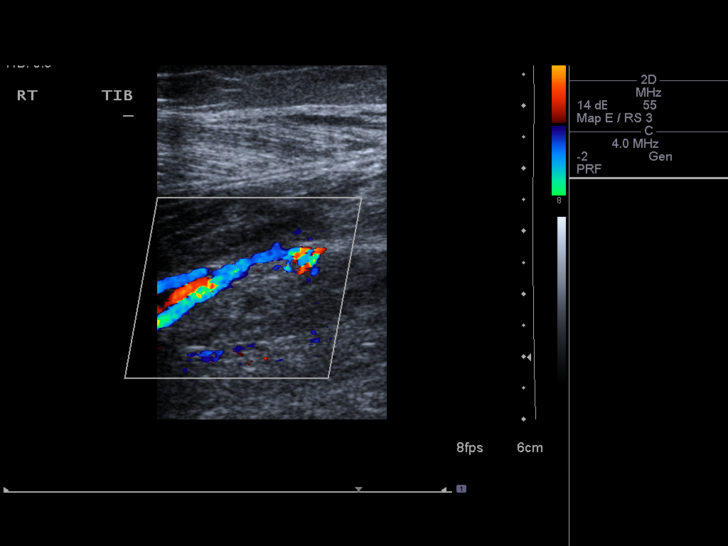
[im 26/33]
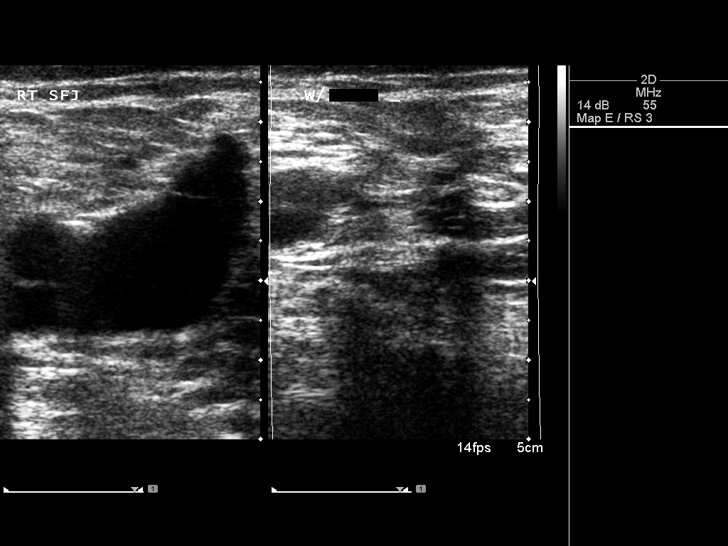
[im 27/33]
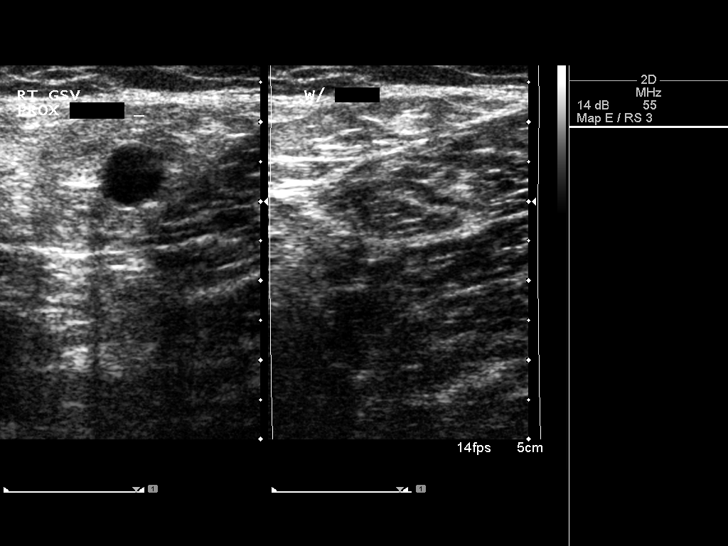
[im 30/33]
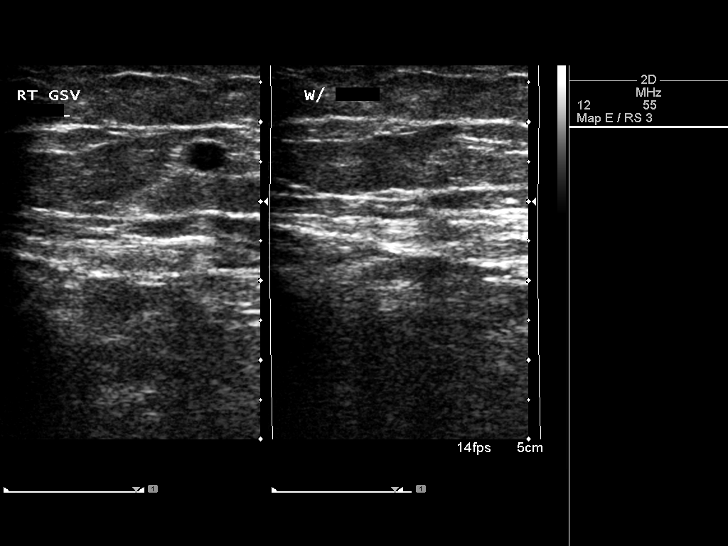
[im 33/33]
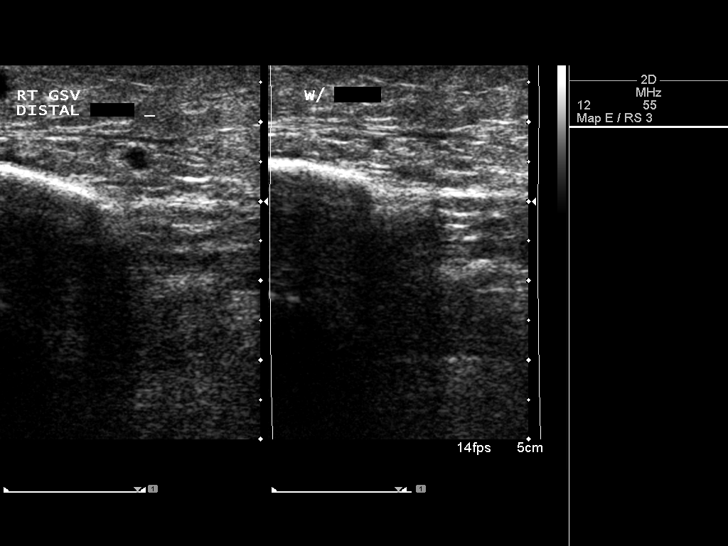

[14 of 24 positions shown; findings below may reference images not displayed]

FINDINGS: Normal compressibility of the common femoral, superficial femoral,
and popliteal veins, as well as the proximal calf veins. No filling
defects to suggest DVT on grayscale or color Doppler imaging.
Doppler waveforms show normal direction of venous flow, normal
respiratory phasicity and response to augmentation. Visualized
segments of the saphenous venous system normal in caliber and
compressibility. Survey views of the contralateral common femoral
vein are unremarkable.
IMPRESSION: No evidence of  lower extremity deep vein thrombosis, right.

## 2017-05-27 DIAGNOSIS — L218 Other seborrheic dermatitis: Secondary | ICD-10-CM | POA: Diagnosis not present

## 2017-05-27 DIAGNOSIS — Z85828 Personal history of other malignant neoplasm of skin: Secondary | ICD-10-CM | POA: Diagnosis not present

## 2017-05-27 DIAGNOSIS — L570 Actinic keratosis: Secondary | ICD-10-CM | POA: Diagnosis not present

## 2017-05-27 DIAGNOSIS — D225 Melanocytic nevi of trunk: Secondary | ICD-10-CM | POA: Diagnosis not present

## 2017-05-27 DIAGNOSIS — L814 Other melanin hyperpigmentation: Secondary | ICD-10-CM | POA: Diagnosis not present

## 2017-06-30 ENCOUNTER — Ambulatory Visit (INDEPENDENT_AMBULATORY_CARE_PROVIDER_SITE_OTHER): Payer: BLUE CROSS/BLUE SHIELD | Admitting: Physician Assistant

## 2017-06-30 ENCOUNTER — Other Ambulatory Visit: Payer: Self-pay

## 2017-06-30 ENCOUNTER — Encounter: Payer: Self-pay | Admitting: Physician Assistant

## 2017-06-30 VITALS — BP 136/92 | HR 80 | Temp 98.5°F | Resp 18 | Ht 71.5 in | Wt 291.0 lb

## 2017-06-30 DIAGNOSIS — Z1329 Encounter for screening for other suspected endocrine disorder: Secondary | ICD-10-CM

## 2017-06-30 DIAGNOSIS — Z125 Encounter for screening for malignant neoplasm of prostate: Secondary | ICD-10-CM | POA: Diagnosis not present

## 2017-06-30 DIAGNOSIS — Z13228 Encounter for screening for other metabolic disorders: Secondary | ICD-10-CM | POA: Diagnosis not present

## 2017-06-30 DIAGNOSIS — Z130 Encounter for screening for diseases of the blood and blood-forming organs and certain disorders involving the immune mechanism: Secondary | ICD-10-CM | POA: Diagnosis not present

## 2017-06-30 DIAGNOSIS — Z0000 Encounter for general adult medical examination without abnormal findings: Secondary | ICD-10-CM | POA: Diagnosis not present

## 2017-06-30 DIAGNOSIS — Z13220 Encounter for screening for lipoid disorders: Secondary | ICD-10-CM

## 2017-06-30 NOTE — Progress Notes (Signed)
PRIMARY CARE AT [facility] [address], [place] [address] [phone]  Date:  06/30/2017   Name:  William Leach   DOB:  08-19-1955   MRN:  [id number]  PCP:  Patient, No Pcp Per    History of Present Illness:  William Leach is a 62 y.o. male patient who presents to PCP with  Chief Complaint  Patient presents with  . 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      Patient is here for physical exam and to review a hemorrhage of his right eye.  This has been the 3rd subconjunctival hemorrhage which was diagnosed [facility].  They have requested to go to the ED.  Patient denies any pain the the right eye.  DIET he has no restrictions.  He has a 30lb weight gain since his last physical 10/2015.  He is eating a lot of fried foods.  Vegetables daily.  Water intake: 32-48oz.  Caffeine: coffee 5-6 cups.    BM normal.  No black or bloody stool.   URINATION normal.  No abnormal frequency, hematuria.  He has no weakened stream routinely.  SLEEP 5-6 hours sleeping somewhat well.    SOCIAL ACTIVITY Exercise: none.  He is performing some stretches.   He is driving a lot.   Wt Readings from Last 3 Encounters:  06/30/17 291 lb (132 kg)  01/12/16 262 lb (118.8 kg)  12/04/15 267 lb 12.8 oz (121.5 kg)    Patient Active Problem List   Diagnosis Date Noted  . Tubular adenoma of colon 01/26/2016    Past Medical History:  Diagnosis Date  . Arthritis     Past Surgical History:  Procedure Laterality Date  . ELBOW SURGERY Right    25 YEARS AGO    Social History   Tobacco Use  . Smoking status: Former Smoker    Types: Cigarettes  . Smokeless tobacco: Never Used  Substance Use Topics  . Alcohol use: Not on file  . Drug use: Not on file    Family History  Problem Relation Age of Onset  . Diabetes Father     No Known Allergies  Medication list has been reviewed and  updated.  Current Outpatient Medications on File Prior to Visit  Medication Sig Dispense Refill  . acetaminophen (TYLENOL) 500 MG tablet Take 1,000 mg by mouth every 6 (six) hours as needed.     No current facility-administered medications on file prior to visit.     ROS ROS otherwise unremarkable unless listed above.  Physical Examination: BP (!) 168/104 (BP Location: Left Arm, Patient Position: Sitting, Cuff Size: Large)   Pulse 80   Temp 98.5 F (36.9 C) (Oral)   Resp 18   Ht 5' 11.5" (1.816 m)   Wt 291 lb (132 kg)   BMI 40.02 kg/m  Ideal Body Weight: Weight in (lb) to have BMI = 25: 181.4  Physical Exam   Assessment and Plan: William Leach is a 62 y.o. male who is here today  There are no diagnoses linked to this encounter.  Ivar Drape, PA-C [facility] 06/30/2017 3:47 PM

## 2017-06-30 NOTE — Patient Instructions (Addendum)
DASH Eating Plan DASH stands for "Dietary Approaches to Stop Hypertension." The DASH eating plan is a healthy eating plan that has been shown to reduce high blood pressure (hypertension). It may also reduce your risk for type 2 diabetes, heart disease, and stroke. The DASH eating plan may also help with weight loss. What are tips for following this plan? General guidelines  Avoid eating more than 2,300 mg (milligrams) of salt (sodium) a day. If you have hypertension, you may need to reduce your sodium intake to 1,500 mg a day.  Limit alcohol intake to no more than 1 drink a day for nonpregnant women and 2 drinks a day for men. One drink equals 12 oz of beer, 5 oz of wine, or 1 oz of hard liquor.  Work with your health care provider to maintain a healthy body weight or to lose weight. Ask what an ideal weight is for you.  Get at least 30 minutes of exercise that causes your heart to beat faster (aerobic exercise) most days of the week. Activities may include walking, swimming, or biking.  Work with your health care provider or diet and nutrition specialist (dietitian) to adjust your eating plan to your individual calorie needs. Reading food labels  Check food labels for the amount of sodium per serving. Choose foods with less than 5 percent of the Daily Value of sodium. Generally, foods with less than 300 mg of sodium per serving fit into this eating plan.  To find whole grains, look for the word "whole" as the first word in the ingredient list. Shopping  Buy products labeled as "low-sodium" or "no salt added."  Buy fresh foods. Avoid canned foods and premade or frozen meals. Cooking  Avoid adding salt when cooking. Use salt-free seasonings or herbs instead of table salt or sea salt. Check with your health care provider or pharmacist before using salt substitutes.  Do not fry foods. Cook foods using healthy methods such as baking, boiling, grilling, and broiling instead.  Cook with  heart-healthy oils, such as olive, canola, soybean, or sunflower oil. Meal planning   Eat a balanced diet that includes: ? 5 or more servings of fruits and vegetables each day. At each meal, try to fill half of your plate with fruits and vegetables. ? Up to 6-8 servings of whole grains each day. ? Less than 6 oz of lean meat, poultry, or fish each day. A 3-oz serving of meat is about the same size as a deck of cards. One egg equals 1 oz. ? 2 servings of low-fat dairy each day. ? A serving of nuts, seeds, or beans 5 times each week. ? Heart-healthy fats. Healthy fats called Omega-3 fatty acids are found in foods such as flaxseeds and coldwater fish, like sardines, salmon, and mackerel.  Limit how much you eat of the following: ? Canned or prepackaged foods. ? Food that is high in trans fat, such as fried foods. ? Food that is high in saturated fat, such as fatty meat. ? Sweets, desserts, sugary drinks, and other foods with added sugar. ? Full-fat dairy products.  Do not salt foods before eating.  Try to eat at least 2 vegetarian meals each week.  Eat more home-cooked food and less restaurant, buffet, and fast food.  When eating at a restaurant, ask that your food be prepared with less salt or no salt, if possible. What foods are recommended? The items listed may not be a complete list. Talk with your dietitian about what   dietary choices are best for you. Grains Whole-grain or whole-wheat bread. Whole-grain or whole-wheat pasta. Brown rice. Oatmeal. Quinoa. Bulgur. Whole-grain and low-sodium cereals. Pita bread. Low-fat, low-sodium crackers. Whole-wheat flour tortillas. Vegetables Fresh or frozen vegetables (raw, steamed, roasted, or grilled). Low-sodium or reduced-sodium tomato and vegetable juice. Low-sodium or reduced-sodium tomato sauce and tomato paste. Low-sodium or reduced-sodium canned vegetables. Fruits All fresh, dried, or frozen fruit. Canned fruit in natural juice (without  added sugar). Meat and other protein foods Skinless chicken or turkey. Ground chicken or turkey. Pork with fat trimmed off. Fish and seafood. Egg whites. Dried beans, peas, or lentils. Unsalted nuts, nut butters, and seeds. Unsalted canned beans. Lean cuts of beef with fat trimmed off. Low-sodium, lean deli meat. Dairy Low-fat (1%) or fat-free (skim) milk. Fat-free, low-fat, or reduced-fat cheeses. Nonfat, low-sodium ricotta or cottage cheese. Low-fat or nonfat yogurt. Low-fat, low-sodium cheese. Fats and oils Soft margarine without trans fats. Vegetable oil. Low-fat, reduced-fat, or light mayonnaise and salad dressings (reduced-sodium). Canola, safflower, olive, soybean, and sunflower oils. Avocado. Seasoning and other foods Herbs. Spices. Seasoning mixes without salt. Unsalted popcorn and pretzels. Fat-free sweets. What foods are not recommended? The items listed may not be a complete list. Talk with your dietitian about what dietary choices are best for you. Grains Baked goods made with fat, such as croissants, muffins, or some breads. Dry pasta or rice meal packs. Vegetables Creamed or fried vegetables. Vegetables in a cheese sauce. Regular canned vegetables (not low-sodium or reduced-sodium). Regular canned tomato sauce and paste (not low-sodium or reduced-sodium). Regular tomato and vegetable juice (not low-sodium or reduced-sodium). Pickles. Olives. Fruits Canned fruit in a light or heavy syrup. Fried fruit. Fruit in cream or butter sauce. Meat and other protein foods Fatty cuts of meat. Ribs. Fried meat. Bacon. Sausage. Bologna and other processed lunch meats. Salami. Fatback. Hotdogs. Bratwurst. Salted nuts and seeds. Canned beans with added salt. Canned or smoked fish. Whole eggs or egg yolks. Chicken or turkey with skin. Dairy Whole or 2% milk, cream, and half-and-half. Whole or full-fat cream cheese. Whole-fat or sweetened yogurt. Full-fat cheese. Nondairy creamers. Whipped toppings.  Processed cheese and cheese spreads. Fats and oils Butter. Stick margarine. Lard. Shortening. Ghee. Bacon fat. Tropical oils, such as coconut, palm kernel, or palm oil. Seasoning and other foods Salted popcorn and pretzels. Onion salt, garlic salt, seasoned salt, table salt, and sea salt. Worcestershire sauce. Tartar sauce. Barbecue sauce. Teriyaki sauce. Soy sauce, including reduced-sodium. Steak sauce. Canned and packaged gravies. Fish sauce. Oyster sauce. Cocktail sauce. Horseradish that you find on the shelf. Ketchup. Mustard. Meat flavorings and tenderizers. Bouillon cubes. Hot sauce and Tabasco sauce. Premade or packaged marinades. Premade or packaged taco seasonings. Relishes. Regular salad dressings. Where to find more information:  National Heart, Lung, and Blood Institute: www.nhlbi.nih.gov  American Heart Association: www.heart.org Summary  The DASH eating plan is a healthy eating plan that has been shown to reduce high blood pressure (hypertension). It may also reduce your risk for type 2 diabetes, heart disease, and stroke.  With the DASH eating plan, you should limit salt (sodium) intake to 2,300 mg a day. If you have hypertension, you may need to reduce your sodium intake to 1,500 mg a day.  When on the DASH eating plan, aim to eat more fresh fruits and vegetables, whole grains, lean proteins, low-fat dairy, and heart-healthy fats.  Work with your health care provider or diet and nutrition specialist (dietitian) to adjust your eating plan to your individual   calorie needs. This information is not intended to replace advice given to you by your health care provider. Make sure you discuss any questions you have with your health care provider. Document Released: 03/14/2011 Document Revised: 03/18/2016 Document Reviewed: 03/18/2016 Elsevier Interactive Patient Education  2018 [place] you healthy  Get these tests  Blood pressure- Have your blood pressure checked  once a year by your healthcare provider.  Normal blood pressure is 120/80  Weight- Have your body mass index (BMI) calculated to screen for obesity.  BMI is a measure of body fat based on height and weight. You can also calculate your own BMI at ViewBanking.[email or web address].  Cholesterol- Have your cholesterol checked every year.  Diabetes- Have your blood sugar checked regularly if you have high blood pressure, high cholesterol, have a family history of diabetes or if you are overweight.  Screening for Colon Cancer- Colonoscopy starting at age 65.  Screening may begin sooner depending on your family history and other health conditions. Follow up colonoscopy as directed by your Gastroenterologist.  Screening for Prostate Cancer- Both blood work (PSA) and a rectal exam help screen for Prostate Cancer.  Screening begins at age 60 with African-American men and at age 18 with Caucasian men.  Screening may begin sooner depending on your family history.  Take these medicines  Aspirin- One aspirin daily can help prevent Heart disease and Stroke.  Flu shot- Every fall.  Tetanus- Every 10 years.  Zostavax- Once after the age of 13 to prevent Shingles.  Pneumonia shot- Once after the age of 65; if you are younger than 9, ask your healthcare provider if you need a Pneumonia shot.  Take these steps  Don't smoke- If you do smoke, talk to your doctor about quitting.  For tips on how to quit, go to www.smokefree.gov or call 1-800-QUIT-NOW.  Be physically active- Exercise 5 days a week for at least 30 minutes.  If you are not already physically active start slow and gradually work up to 30 minutes of moderate physical activity.  Examples of moderate activity include walking briskly, mowing the yard, dancing, swimming, bicycling, etc.  Eat a healthy diet- Eat a variety of healthy food such as fruits, vegetables, low fat milk, low fat cheese, yogurt, lean meant, poultry, fish, beans, tofu, etc. For more  information go to www.thenutritionsource.org  Drink alcohol in moderation- Limit alcohol intake to less than two drinks a day. Never drink and drive.  Dentist- Brush and floss twice daily; visit your dentist twice a year.  Depression- Your emotional health is as important as your physical health. If you're feeling down, or losing interest in things you would normally enjoy please talk to your healthcare provider.  Eye exam- Visit your eye doctor every year.  Safe sex- If you may be exposed to a sexually transmitted infection, use a condom.  Seat belts- Seat belts can save your life; always wear one.  Smoke/Carbon Monoxide detectors- These detectors need to be installed on the appropriate level of your home.  Replace batteries at least once a year.  Skin cancer- When out in the sun, cover up and use sunscreen 15 SPF or higher.  Violence- If anyone is threatening you, please tell your healthcare provider.  Living Will/ Health care power of attorney- Speak with your healthcare provider and family.   IF you received an x-ray today, you will receive an invoice from [facility]. Please contact [facility] at [phone] with questions or concerns regarding your invoice.  IF you received labwork today, you will receive an invoice from [facility]. Please contact [facility] at [phone] with questions or concerns regarding your invoice.   Our billing staff will not be able to assist you with questions regarding bills from these companies.  You will be contacted with the lab results as soon as they are available. The fastest way to get your results is to activate your My Chart account. Instructions are located on the last page of this paperwork. If you have not heard from [place] regarding the results in 2 weeks, please contact this office.

## 2017-07-01 LAB — CMP14+EGFR
ALT: 28 IU/L (ref 0–44)
AST: 26 IU/L (ref 0–40)
Albumin/Globulin Ratio: 1.6 (ref 1.2–2.2)
Albumin: 4.7 g/dL (ref 3.6–4.8)
Alkaline Phosphatase: 45 IU/L (ref 39–117)
BUN/Creatinine Ratio: 16 (ref 10–24)
BUN: 13 mg/dL (ref 8–27)
Bilirubin Total: 0.3 mg/dL (ref 0.0–1.2)
CO2: 21 mmol/L (ref 20–29)
Calcium: 9.6 mg/dL (ref 8.6–10.2)
Chloride: 102 mmol/L (ref 96–106)
Creatinine, Ser: 0.8 mg/dL (ref 0.76–1.27)
GFR calc Af Amer: 111 mL/min/{1.73_m2} (ref >59)
GFR calc non Af Amer: 96 mL/min/{1.73_m2} (ref >59)
Globulin, Total: 2.9 g/dL (ref 1.5–4.5)
Glucose: 69 mg/dL (ref 65–99)
Potassium: 4.1 mmol/L (ref 3.5–5.2)
Sodium: 140 mmol/L (ref 134–144)
Total Protein: 7.6 g/dL (ref 6.0–8.5)

## 2017-07-01 LAB — CBC
Hematocrit: 41.2 % (ref 37.5–51.0)
Hemoglobin: 14.4 g/dL (ref 13.0–17.7)
MCH: 32.7 pg (ref 26.6–33.0)
MCHC: 35 g/dL (ref 31.5–35.7)
MCV: 93 fL (ref 79–97)
Platelets: 278 10*3/uL (ref 150–379)
RBC: 4.41 x10E6/uL (ref 4.14–5.80)
RDW: 13.1 % (ref 12.3–15.4)
WBC: 7.7 10*3/uL (ref 3.4–10.8)

## 2017-07-01 LAB — PROTIME-INR
INR: 1 (ref 0.8–1.2)
Prothrombin Time: 10.4 s (ref 9.1–12.0)

## 2017-07-01 LAB — LIPID PANEL
Chol/HDL Ratio: 5.3 ratio — ABNORMAL HIGH (ref 0.0–5.0)
Cholesterol, Total: 224 mg/dL — ABNORMAL HIGH (ref 100–199)
HDL: 42 mg/dL (ref >39)
LDL Calculated: 110 mg/dL — ABNORMAL HIGH (ref 0–99)
Triglycerides: 361 mg/dL — ABNORMAL HIGH (ref 0–149)
VLDL Cholesterol Cal: 72 mg/dL — ABNORMAL HIGH (ref 5–40)

## 2017-07-01 LAB — PSA: Prostate Specific Ag, Serum: 0.3 ng/mL (ref 0.0–4.0)

## 2017-07-01 LAB — TSH: TSH: 2.65 u[IU]/mL (ref 0.450–4.500)

## 2017-07-07 ENCOUNTER — Encounter: Payer: Self-pay | Admitting: Physician Assistant

## 2017-07-23 NOTE — Progress Notes (Signed)
Called pt. Pt wants to think about statin therapy and will be advising office once he makes his decision.

## 2017-12-22 DIAGNOSIS — L821 Other seborrheic keratosis: Secondary | ICD-10-CM | POA: Diagnosis not present

## 2017-12-22 DIAGNOSIS — L738 Other specified follicular disorders: Secondary | ICD-10-CM | POA: Diagnosis not present

## 2017-12-22 DIAGNOSIS — D225 Melanocytic nevi of trunk: Secondary | ICD-10-CM | POA: Diagnosis not present

## 2017-12-22 DIAGNOSIS — L570 Actinic keratosis: Secondary | ICD-10-CM | POA: Diagnosis not present

## 2017-12-22 DIAGNOSIS — D1801 Hemangioma of skin and subcutaneous tissue: Secondary | ICD-10-CM | POA: Diagnosis not present

## 2018-12-21 DIAGNOSIS — L218 Other seborrheic dermatitis: Secondary | ICD-10-CM | POA: Diagnosis not present

## 2018-12-21 DIAGNOSIS — D485 Neoplasm of uncertain behavior of skin: Secondary | ICD-10-CM | POA: Diagnosis not present

## 2018-12-21 DIAGNOSIS — L821 Other seborrheic keratosis: Secondary | ICD-10-CM | POA: Diagnosis not present

## 2018-12-21 DIAGNOSIS — L820 Inflamed seborrheic keratosis: Secondary | ICD-10-CM | POA: Diagnosis not present

## 2018-12-21 DIAGNOSIS — C44519 Basal cell carcinoma of skin of other part of trunk: Secondary | ICD-10-CM | POA: Diagnosis not present

## 2018-12-21 DIAGNOSIS — D1801 Hemangioma of skin and subcutaneous tissue: Secondary | ICD-10-CM | POA: Diagnosis not present

## 2018-12-21 DIAGNOSIS — L570 Actinic keratosis: Secondary | ICD-10-CM | POA: Diagnosis not present

## 2018-12-21 DIAGNOSIS — D225 Melanocytic nevi of trunk: Secondary | ICD-10-CM | POA: Diagnosis not present

## 2018-12-31 DIAGNOSIS — C44519 Basal cell carcinoma of skin of other part of trunk: Secondary | ICD-10-CM | POA: Diagnosis not present

## 2019-03-22 DIAGNOSIS — L570 Actinic keratosis: Secondary | ICD-10-CM | POA: Diagnosis not present

## 2019-03-22 DIAGNOSIS — L905 Scar conditions and fibrosis of skin: Secondary | ICD-10-CM | POA: Diagnosis not present

## 2019-03-22 DIAGNOSIS — Z85828 Personal history of other malignant neoplasm of skin: Secondary | ICD-10-CM | POA: Diagnosis not present

## 2019-04-27 ENCOUNTER — Other Ambulatory Visit: Payer: Self-pay

## 2019-04-27 ENCOUNTER — Ambulatory Visit (INDEPENDENT_AMBULATORY_CARE_PROVIDER_SITE_OTHER): Payer: BC Managed Care – PPO | Admitting: Emergency Medicine

## 2019-04-27 ENCOUNTER — Encounter: Payer: Self-pay | Admitting: *Deleted

## 2019-04-27 ENCOUNTER — Encounter: Payer: Self-pay | Admitting: Emergency Medicine

## 2019-04-27 VITALS — BP 156/94 | HR 85 | Temp 98.0°F | Resp 16 | Ht 73.0 in | Wt 284.0 lb

## 2019-04-27 DIAGNOSIS — Z23 Encounter for immunization: Secondary | ICD-10-CM

## 2019-04-27 DIAGNOSIS — Z13220 Encounter for screening for lipoid disorders: Secondary | ICD-10-CM

## 2019-04-27 DIAGNOSIS — Z114 Encounter for screening for human immunodeficiency virus [HIV]: Secondary | ICD-10-CM

## 2019-04-27 DIAGNOSIS — U071 COVID-19: Secondary | ICD-10-CM | POA: Diagnosis not present

## 2019-04-27 DIAGNOSIS — Z7689 Persons encountering health services in other specified circumstances: Secondary | ICD-10-CM

## 2019-04-27 DIAGNOSIS — Z8616 Personal history of COVID-19: Secondary | ICD-10-CM | POA: Diagnosis not present

## 2019-04-27 DIAGNOSIS — Z13228 Encounter for screening for other metabolic disorders: Secondary | ICD-10-CM | POA: Diagnosis not present

## 2019-04-27 DIAGNOSIS — Z1159 Encounter for screening for other viral diseases: Secondary | ICD-10-CM

## 2019-04-27 NOTE — Progress Notes (Signed)
William Leach 64 y.o.   Chief Complaint  Patient presents with  . Establish Care    transferr of care - former S. English PA-C, per patient positive for COVID 19 04/12/2019      HISTORY OF PRESENT ILLNESS: This is a 64 y.o. male here to establish care with me.  Used to see PA [place]. Tested positive for COVID-19 on 04/12/2019 with minimal symptoms.  Was symptomatic for about 3 to 4 days.  Has been asymptomatic for the past 12 days.  Had a headache and general achiness with mild diarrhea. No chronic medical problems.  Non-smoker. Healthy male with a healthy lifestyle. No complaints or medical concerns today.  HPI   Prior to Admission medications   Medication Sig Start Date End Date Taking? Authorizing Provider  acetaminophen (TYLENOL) 500 MG tablet Take 1,000 mg by mouth every 6 (six) hours as needed.   Yes [provider]  aspirin 325 MG tablet Take 325 mg by mouth every other day.   Yes [provider]    No Known Allergies  Patient Active Problem List   Diagnosis Date Noted  . Tubular adenoma of colon 01/26/2016    Past Medical History:  Diagnosis Date  . Arthritis     Past Surgical History:  Procedure Laterality Date  . ELBOW SURGERY Right    25 YEARS AGO    Social History   Socioeconomic History  . Marital status: Married    Spouse name: Not on file  . Number of children: Not on file  . Years of education: Not on file  . Highest education level: Not on file  Occupational History  . Not on file  Tobacco Use  . Smoking status: Former Smoker    Types: Cigarettes  . Smokeless tobacco: Never Used  Substance and Sexual Activity  . Alcohol use: Not on file  . Drug use: Not on file  . Sexual activity: Not on file  Other Topics Concern  . Not on file  Social History Narrative  . Not on file   Social Determinants of Health   Financial Resource Strain:   . Difficulty of Paying Living Expenses: Not on file  Food Insecurity:   .  Worried About Charity fundraiser in the Last Year: Not on file  . Ran Out of Food in the Last Year: Not on file  Transportation Needs:   . Lack of Transportation (Medical): Not on file  . Lack of Transportation (Non-Medical): Not on file  Physical Activity:   . Days of Exercise per Week: Not on file  . Minutes of Exercise per Session: Not on file  Stress:   . Feeling of Stress : Not on file  Social Connections:   . Frequency of Communication with Friends and Family: Not on file  . Frequency of Social Gatherings with Friends and Family: Not on file  . Attends Religious Services: Not on file  . Active Member of Clubs or Organizations: Not on file  . Attends Archivist Meetings: Not on file  . Marital Status: Not on file  Intimate Partner Violence:   . Fear of Current or Ex-Partner: Not on file  . Emotionally Abused: Not on file  . Physically Abused: Not on file  . Sexually Abused: Not on file    Family History  Problem Relation Age of Onset  . Diabetes Father      Review of Systems  Constitutional: Negative.  Negative for fever.  HENT: Negative.  Negative for congestion and sore throat.   Respiratory: Negative.  Negative for cough and shortness of breath.   Cardiovascular: Negative.  Negative for chest pain and palpitations.  Gastrointestinal: Negative.  Negative for abdominal pain, blood in stool, diarrhea, melena, nausea and vomiting.  Genitourinary: Negative.  Negative for dysuria and hematuria.  Musculoskeletal: Negative.  Negative for back pain, myalgias and neck pain.  Skin: Negative.  Negative for rash.  Neurological: Negative.  Negative for dizziness and headaches.  All other systems reviewed and are negative.  Today's Vitals   04/27/19 1538  BP: (!) 156/94  Pulse: 85  Resp: 16  Temp: 98 F (36.7 C)  TempSrc: Temporal  SpO2: 98%  Weight: 284 lb (128.8 kg)  Height: 6\' 1"  (1.854 m)   Body mass index is 37.47 kg/m.   Physical Exam Vitals  reviewed.  Constitutional:      Appearance: Normal appearance.  HENT:     Head: Normocephalic.  Eyes:     Extraocular Movements: Extraocular movements intact.     Conjunctiva/sclera: Conjunctivae normal.     Pupils: Pupils are equal, round, and reactive to light.  Cardiovascular:     Rate and Rhythm: Normal rate and regular rhythm.     Pulses: Normal pulses.     Heart sounds: Normal heart sounds.  Pulmonary:     Effort: Pulmonary effort is normal.     Breath sounds: Normal breath sounds.  Musculoskeletal:        General: Normal range of motion.     Cervical back: Normal range of motion and neck supple.  Skin:    General: Skin is warm and dry.     Capillary Refill: Capillary refill takes less than 2 seconds.  Neurological:     General: No focal deficit present.     Mental Status: He is alert and oriented to person, place, and time.  Psychiatric:        Mood and Affect: Mood normal.        Behavior: Behavior normal.      ASSESSMENT & PLAN: [name] was seen today for establish care.  Diagnoses and all orders for this visit:  COVID-19 virus infection Comments: Recent and recovering well Orders: -     SAR CoV2 Serology (COVID 19)AB(IGG)IA  Encounter to establish care  Screening for metabolic disorder -     Comprehensive metabolic panel  Screening for lipid disorders -     Lipid panel  Need for prophylactic vaccination and inoculation against influenza  Screening for HIV (human immunodeficiency virus) -     HIV antibody (with reflex)  Need for hepatitis C screening test -     HCV Ab w Reflex to Quant PCR    Patient Instructions       If you have lab work done today you will be contacted with your lab results within the next 2 weeks.  If you have not heard from [place] then please contact us. The fastest way to get your results is to register for My Chart.   IF you received an x-ray today, you will receive an invoice from [facility]. Please contact  [facility] at [phone] with questions or concerns regarding your invoice.   IF you received labwork today, you will receive an invoice from [facility]. Please contact [facility] at [phone] with questions or concerns regarding your invoice.   Our billing staff will not be able to assist you with questions regarding bills from these companies.  You will be contacted  with the lab results as soon as they are available. The fastest way to get your results is to activate your My Chart account. Instructions are located on the last page of this paperwork. If you have not heard from [place] regarding the results in 2 weeks, please contact this office.     Health Maintenance, Male Adopting a healthy lifestyle and getting preventive care are important in promoting health and wellness. Ask your health care provider about:  The right schedule for you to have regular tests and exams.  Things you can do on your own to prevent diseases and keep yourself healthy. What should I know about diet, weight, and exercise? Eat a healthy diet   Eat a diet that includes plenty of vegetables, fruits, low-fat dairy products, and lean protein.  Do not eat a lot of foods that are high in solid fats, added sugars, or sodium. Maintain a healthy weight Body mass index (BMI) is a measurement that can be used to identify possible weight problems. It estimates body fat based on height and weight. Your health care provider can help determine your BMI and help you achieve or maintain a healthy weight. Get regular exercise Get regular exercise. This is one of the most important things you can do for your health. Most adults should:  Exercise for at least 150 minutes each week. The exercise should increase your heart rate and make you sweat (moderate-intensity exercise).  Do strengthening exercises at least twice a week. This is in addition to the moderate-intensity exercise.  Spend less time sitting. Even light  physical activity can be beneficial. Watch cholesterol and blood lipids Have your blood tested for lipids and cholesterol at 64 years of age, then have this test every 5 years. You may need to have your cholesterol levels checked more often if:  Your lipid or cholesterol levels are high.  You are older than 64 years of age.  You are at high risk for heart disease. What should I know about cancer screening? Many types of cancers can be detected early and may often be prevented. Depending on your health history and family history, you may need to have cancer screening at various ages. This may include screening for:  Colorectal cancer.  Prostate cancer.  Skin cancer.  Lung cancer. What should I know about heart disease, diabetes, and high blood pressure? Blood pressure and heart disease  High blood pressure causes heart disease and increases the risk of stroke. This is more likely to develop in people who have high blood pressure readings, are of African descent, or are overweight.  Talk with your health care provider about your target blood pressure readings.  Have your blood pressure checked: ? Every 3-5 years if you are 59-43 years of age. ? Every year if you are 49 years old or older.  If you are between the ages of 40 and 68 and are a current or former smoker, ask your health care provider if you should have a one-time screening for abdominal aortic aneurysm (AAA). Diabetes Have regular diabetes screenings. This checks your fasting blood sugar level. Have the screening done:  Once every three years after age 58 if you are at a normal weight and have a low risk for diabetes.  More often and at a younger age if you are overweight or have a high risk for diabetes. What should I know about preventing infection? Hepatitis B If you have a higher risk for hepatitis B, you should be screened  for this virus. Talk with your health care provider to find out if you are at risk for  hepatitis B infection. Hepatitis C Blood testing is recommended for:  Everyone born from [birth year] through 1965.  Anyone with known risk factors for hepatitis C. Sexually transmitted infections (STIs)  You should be screened each year for STIs, including gonorrhea and chlamydia, if: ? You are sexually active and are younger than 64 years of age. ? You are older than 64 years of age and your health care provider tells you that you are at risk for this type of infection. ? Your sexual activity has changed since you were last screened, and you are at increased risk for chlamydia or gonorrhea. Ask your health care provider if you are at risk.  Ask your health care provider about whether you are at high risk for HIV. Your health care provider may recommend a prescription medicine to help prevent HIV infection. If you choose to take medicine to prevent HIV, you should first get tested for HIV. You should then be tested every 3 months for as long as you are taking the medicine. Follow these instructions at home: Lifestyle  Do not use any products that contain nicotine or tobacco, such as cigarettes, e-cigarettes, and chewing tobacco. If you need help quitting, ask your health care provider.  Do not use street drugs.  Do not share needles.  Ask your health care provider for help if you need support or information about quitting drugs. Alcohol use  Do not drink alcohol if your health care provider tells you not to drink.  If you drink alcohol: ? Limit how much you have to 0-2 drinks a day. ? Be aware of how much alcohol is in your drink. In the [place] one drink equals one 12 oz bottle of beer (355 mL), one 5 oz glass of wine (148 mL), or one 1 oz glass of hard liquor (44 mL). General instructions  Schedule regular health, dental, and eye exams.  Stay current with your vaccines.  Tell your health care provider if: ? You often feel depressed. ? You have ever been abused or do not feel safe at  home. Summary  Adopting a healthy lifestyle and getting preventive care are important in promoting health and wellness.  Follow your health care provider's instructions about healthy diet, exercising, and getting tested or screened for diseases.  Follow your health care provider's instructions on monitoring your cholesterol and blood pressure. This information is not intended to replace advice given to you by your health care provider. Make sure you discuss any questions you have with your health care provider. Document Revised: 03/18/2018 Document Reviewed: 03/18/2018 Elsevier Patient Education  2020 Elsevier Inc.      Agustina Caroli, MD [facility]

## 2019-04-27 NOTE — Patient Instructions (Addendum)
   If you have lab work done today you will be contacted with your lab results within the next 2 weeks.  If you have not heard from us then please contact us. The fastest way to get your results is to register for My Chart.   IF you received an x-ray today, you will receive an invoice from [facility]. Please contact  Radiology at [phone] with questions or concerns regarding your invoice.   IF you received labwork today, you will receive an invoice from [facility]. Please contact [facility] at [phone] with questions or concerns regarding your invoice.   Our billing staff will not be able to assist you with questions regarding bills from these companies.  You will be contacted with the lab results as soon as they are available. The fastest way to get your results is to activate your My Chart account. Instructions are located on the last page of this paperwork. If you have not heard from us regarding the results in 2 weeks, please contact this office.      Health Maintenance, Male Adopting a healthy lifestyle and getting preventive care are important in promoting health and wellness. Ask your health care provider about:  The right schedule for you to have regular tests and exams.  Things you can do on your own to prevent diseases and keep yourself healthy. What should I know about diet, weight, and exercise? Eat a healthy diet   Eat a diet that includes plenty of vegetables, fruits, low-fat dairy products, and lean protein.  Do not eat a lot of foods that are high in solid fats, added sugars, or sodium. Maintain a healthy weight Body mass index (BMI) is a measurement that can be used to identify possible weight problems. It estimates body fat based on height and weight. Your health care provider can help determine your BMI and help you achieve or maintain a healthy weight. Get regular exercise Get regular exercise. This is one of the most important things you  can do for your health. Most adults should:  Exercise for at least 150 minutes each week. The exercise should increase your heart rate and make you sweat (moderate-intensity exercise).  Do strengthening exercises at least twice a week. This is in addition to the moderate-intensity exercise.  Spend less time sitting. Even light physical activity can be beneficial. Watch cholesterol and blood lipids Have your blood tested for lipids and cholesterol at 64 years of age, then have this test every 5 years. You may need to have your cholesterol levels checked more often if:  Your lipid or cholesterol levels are high.  You are older than 64 years of age.  You are at high risk for heart disease. What should I know about cancer screening? Many types of cancers can be detected early and may often be prevented. Depending on your health history and family history, you may need to have cancer screening at various ages. This may include screening for:  Colorectal cancer.  Prostate cancer.  Skin cancer.  Lung cancer. What should I know about heart disease, diabetes, and high blood pressure? Blood pressure and heart disease  High blood pressure causes heart disease and increases the risk of stroke. This is more likely to develop in people who have high blood pressure readings, are of African descent, or are overweight.  Talk with your health care provider about your target blood pressure readings.  Have your blood pressure checked: ? Every 3-5 years if you are 18-39   years of age. ? Every year if you are 40 years old or older.  If you are between the ages of 65 and 75 and are a current or former smoker, ask your health care provider if you should have a one-time screening for abdominal aortic aneurysm (AAA). Diabetes Have regular diabetes screenings. This checks your fasting blood sugar level. Have the screening done:  Once every three years after age 45 if you are at a normal weight and have  a low risk for diabetes.  More often and at a younger age if you are overweight or have a high risk for diabetes. What should I know about preventing infection? Hepatitis B If you have a higher risk for hepatitis B, you should be screened for this virus. Talk with your health care provider to find out if you are at risk for hepatitis B infection. Hepatitis C Blood testing is recommended for:  Everyone born from 1945 through 1965.  Anyone with known risk factors for hepatitis C. Sexually transmitted infections (STIs)  You should be screened each year for STIs, including gonorrhea and chlamydia, if: ? You are sexually active and are younger than 64 years of age. ? You are older than 64 years of age and your health care provider tells you that you are at risk for this type of infection. ? Your sexual activity has changed since you were last screened, and you are at increased risk for chlamydia or gonorrhea. Ask your health care provider if you are at risk.  Ask your health care provider about whether you are at high risk for HIV. Your health care provider may recommend a prescription medicine to help prevent HIV infection. If you choose to take medicine to prevent HIV, you should first get tested for HIV. You should then be tested every 3 months for as long as you are taking the medicine. Follow these instructions at home: Lifestyle  Do not use any products that contain nicotine or tobacco, such as cigarettes, e-cigarettes, and chewing tobacco. If you need help quitting, ask your health care provider.  Do not use street drugs.  Do not share needles.  Ask your health care provider for help if you need support or information about quitting drugs. Alcohol use  Do not drink alcohol if your health care provider tells you not to drink.  If you drink alcohol: ? Limit how much you have to 0-2 drinks a day. ? Be aware of how much alcohol is in your drink. In the [place] one drink equals one 12  oz bottle of beer (355 mL), one 5 oz glass of wine (148 mL), or one 1 oz glass of hard liquor (44 mL). General instructions  Schedule regular health, dental, and eye exams.  Stay current with your vaccines.  Tell your health care provider if: ? You often feel depressed. ? You have ever been abused or do not feel safe at home. Summary  Adopting a healthy lifestyle and getting preventive care are important in promoting health and wellness.  Follow your health care provider's instructions about healthy diet, exercising, and getting tested or screened for diseases.  Follow your health care provider's instructions on monitoring your cholesterol and blood pressure. This information is not intended to replace advice given to you by your health care provider. Make sure you discuss any questions you have with your health care provider. Document Revised: 03/18/2018 Document Reviewed: 03/18/2018 Elsevier Patient Education  2020 Elsevier Inc.  

## 2019-04-28 ENCOUNTER — Encounter: Payer: Self-pay | Admitting: Emergency Medicine

## 2019-04-28 LAB — LIPID PANEL
Chol/HDL Ratio: 5.6 ratio — ABNORMAL HIGH (ref 0.0–5.0)
Cholesterol, Total: 191 mg/dL (ref 100–199)
HDL: 34 mg/dL — ABNORMAL LOW (ref >39)
LDL Chol Calc (NIH): 97 mg/dL (ref 0–99)
Triglycerides: 357 mg/dL — ABNORMAL HIGH (ref 0–149)
VLDL Cholesterol Cal: 60 mg/dL — ABNORMAL HIGH (ref 5–40)

## 2019-04-28 LAB — COMPREHENSIVE METABOLIC PANEL
ALT: 36 IU/L (ref 0–44)
AST: 30 IU/L (ref 0–40)
Albumin/Globulin Ratio: 2 (ref 1.2–2.2)
Albumin: 4.9 g/dL — ABNORMAL HIGH (ref 3.8–4.8)
Alkaline Phosphatase: 47 IU/L (ref 39–117)
BUN/Creatinine Ratio: 15 (ref 10–24)
BUN: 15 mg/dL (ref 8–27)
Bilirubin Total: 0.3 mg/dL (ref 0.0–1.2)
CO2: 18 mmol/L — ABNORMAL LOW (ref 20–29)
Calcium: 9.7 mg/dL (ref 8.6–10.2)
Chloride: 105 mmol/L (ref 96–106)
Creatinine, Ser: 1.01 mg/dL (ref 0.76–1.27)
GFR calc Af Amer: 90 mL/min/{1.73_m2} (ref >59)
GFR calc non Af Amer: 78 mL/min/{1.73_m2} (ref >59)
Globulin, Total: 2.5 g/dL (ref 1.5–4.5)
Glucose: 98 mg/dL (ref 65–99)
Potassium: 4.4 mmol/L (ref 3.5–5.2)
Sodium: 142 mmol/L (ref 134–144)
Total Protein: 7.4 g/dL (ref 6.0–8.5)

## 2019-04-28 LAB — HIV ANTIBODY (ROUTINE TESTING W REFLEX): HIV Screen 4th Generation wRfx: NONREACTIVE

## 2019-04-28 LAB — SAR COV2 SEROLOGY (COVID19)AB(IGG),IA: DiaSorin SARS-CoV-2 Ab, IgG: POSITIVE — AB

## 2019-04-28 LAB — HCV INTERPRETATION

## 2019-04-28 LAB — HCV AB W REFLEX TO QUANT PCR: HCV Ab: <0.1 s/co ratio (ref 0.0–0.9)

## 2019-09-16 DIAGNOSIS — L570 Actinic keratosis: Secondary | ICD-10-CM | POA: Diagnosis not present

## 2019-09-16 DIAGNOSIS — L738 Other specified follicular disorders: Secondary | ICD-10-CM | POA: Diagnosis not present

## 2019-09-16 DIAGNOSIS — D485 Neoplasm of uncertain behavior of skin: Secondary | ICD-10-CM | POA: Diagnosis not present

## 2019-09-16 DIAGNOSIS — L280 Lichen simplex chronicus: Secondary | ICD-10-CM | POA: Diagnosis not present

## 2019-09-16 DIAGNOSIS — L905 Scar conditions and fibrosis of skin: Secondary | ICD-10-CM | POA: Diagnosis not present

## 2019-09-16 DIAGNOSIS — L821 Other seborrheic keratosis: Secondary | ICD-10-CM | POA: Diagnosis not present

## 2019-09-16 DIAGNOSIS — D225 Melanocytic nevi of trunk: Secondary | ICD-10-CM | POA: Diagnosis not present

## 2020-03-20 ENCOUNTER — Telehealth: Payer: Self-pay | Admitting: General Practice

## 2020-03-20 NOTE — Telephone Encounter (Signed)
Per Patient his brother(wayne Einspahr)  sees you as a PCP. Patient would like to establish care with you   Please Advise

## 2020-03-20 NOTE — Telephone Encounter (Signed)
ok 
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
## 2020-05-03 ENCOUNTER — Other Ambulatory Visit: Payer: Self-pay

## 2020-05-03 ENCOUNTER — Ambulatory Visit (INDEPENDENT_AMBULATORY_CARE_PROVIDER_SITE_OTHER): Payer: BC Managed Care – PPO | Admitting: Family Medicine

## 2020-05-03 ENCOUNTER — Encounter: Payer: Self-pay | Admitting: Family Medicine

## 2020-05-03 VITALS — BP 152/86 | HR 77 | Resp 17 | Ht 73.0 in | Wt 286.0 lb

## 2020-05-03 DIAGNOSIS — Z131 Encounter for screening for diabetes mellitus: Secondary | ICD-10-CM

## 2020-05-03 DIAGNOSIS — I10 Essential (primary) hypertension: Secondary | ICD-10-CM

## 2020-05-03 DIAGNOSIS — Z125 Encounter for screening for malignant neoplasm of prostate: Secondary | ICD-10-CM

## 2020-05-03 DIAGNOSIS — Z23 Encounter for immunization: Secondary | ICD-10-CM | POA: Diagnosis not present

## 2020-05-03 DIAGNOSIS — Z13220 Encounter for screening for lipoid disorders: Secondary | ICD-10-CM

## 2020-05-03 DIAGNOSIS — Z130 Encounter for screening for diseases of the blood and blood-forming organs and certain disorders involving the immune mechanism: Secondary | ICD-10-CM | POA: Diagnosis not present

## 2020-05-03 LAB — COMPREHENSIVE METABOLIC PANEL
ALT: 28 U/L (ref 0–53)
AST: 23 U/L (ref 0–37)
Albumin: 4.6 g/dL (ref 3.5–5.2)
Alkaline Phosphatase: 39 U/L (ref 39–117)
BUN: 13 mg/dL (ref 6–23)
CO2: 30 mEq/L (ref 19–32)
Calcium: 9.6 mg/dL (ref 8.4–10.5)
Chloride: 104 mEq/L (ref 96–112)
Creatinine, Ser: 0.82 mg/dL (ref 0.40–1.50)
GFR: 92.5 mL/min (ref >60.00)
Glucose, Bld: 107 mg/dL — ABNORMAL HIGH (ref 70–99)
Potassium: 4.7 mEq/L (ref 3.5–5.1)
Sodium: 140 mEq/L (ref 135–145)
Total Bilirubin: 0.6 mg/dL (ref 0.2–1.2)
Total Protein: 7.2 g/dL (ref 6.0–8.3)

## 2020-05-03 LAB — CBC
HCT: 42.2 % (ref 39.0–52.0)
Hemoglobin: 14.3 g/dL (ref 13.0–17.0)
MCHC: 33.8 g/dL (ref 30.0–36.0)
MCV: 96.9 fl (ref 78.0–100.0)
Platelets: 235 10*3/uL (ref 150.0–400.0)
RBC: 4.35 Mil/uL (ref 4.22–5.81)
RDW: 12.6 % (ref 11.5–15.5)
WBC: 5.7 10*3/uL (ref 4.0–10.5)

## 2020-05-03 LAB — LIPID PANEL
Cholesterol: 203 mg/dL — ABNORMAL HIGH (ref 0–200)
HDL: 40.2 mg/dL (ref >39.00)
NonHDL: 162.57
Total CHOL/HDL Ratio: 5
Triglycerides: 207 mg/dL — ABNORMAL HIGH (ref 0.0–149.0)
VLDL: 41.4 mg/dL — ABNORMAL HIGH (ref 0.0–40.0)

## 2020-05-03 LAB — PSA: PSA: 0.31 ng/mL (ref 0.10–4.00)

## 2020-05-03 LAB — LDL CHOLESTEROL, DIRECT: Direct LDL: 131 mg/dL

## 2020-05-03 LAB — HEMOGLOBIN A1C: Hgb A1c MFr Bld: 5.9 % (ref 4.6–6.5)

## 2020-05-03 MED ORDER — LOSARTAN POTASSIUM 50 MG PO TABS: 50.0000 mg | ORAL_TABLET | Freq: Every day | ORAL | 3 refills | Status: DC

## 2020-05-03 NOTE — Patient Instructions (Addendum)
Great to meet you today- take care and I will be in touch with your labs asap Pneumonia vaccine today Next dose in one year I do recommend that you get your flu and covid vaccines  Work on exercise- even walking 20 minutes a day can help build fitness and reduce back pain  Your BP is high- we will start treating this to reduce your risk of heart attack and stroke later on Losartan 50 mg Take once daily- let me know if any side effects noted  BP goal is less htan 135/85.  Let me know if your BP does not get to this goal with losartan 50

## 2020-05-04 ENCOUNTER — Encounter: Payer: Self-pay | Admitting: Family Medicine

## 2020-05-04 DIAGNOSIS — R195 Other fecal abnormalities: Secondary | ICD-10-CM

## 2020-05-29 NOTE — Telephone Encounter (Signed)
Called pt- noted dark stools about 5 days ago He uses pepto off and on as well for indigestion No red blood in his stools We will order a stool kit for him to make sure not melena Colonoscopy is UTD

## 2020-05-30 NOTE — Telephone Encounter (Signed)
Kit mailed to patient.

## 2020-07-04 ENCOUNTER — Other Ambulatory Visit: Payer: BC Managed Care – PPO

## 2020-07-04 ENCOUNTER — Other Ambulatory Visit (INDEPENDENT_AMBULATORY_CARE_PROVIDER_SITE_OTHER): Payer: BC Managed Care – PPO

## 2020-07-04 ENCOUNTER — Other Ambulatory Visit: Payer: Self-pay

## 2020-07-04 DIAGNOSIS — R195 Other fecal abnormalities: Secondary | ICD-10-CM

## 2020-07-04 NOTE — Addendum Note (Signed)
Addended by: Kelle Darting A on: 07/04/2020 10:24 AM   Modules accepted: Orders

## 2020-07-05 ENCOUNTER — Encounter: Payer: Self-pay | Admitting: Family Medicine

## 2020-07-05 LAB — FECAL OCCULT BLOOD, IMMUNOCHEMICAL: Fecal Occult Bld: NEGATIVE

## 2020-10-26 DIAGNOSIS — E785 Hyperlipidemia, unspecified: Secondary | ICD-10-CM

## 2020-10-26 DIAGNOSIS — I10 Essential (primary) hypertension: Secondary | ICD-10-CM

## 2020-10-26 DIAGNOSIS — R7303 Prediabetes: Secondary | ICD-10-CM

## 2020-10-26 HISTORY — DX: Essential (primary) hypertension: I10

## 2020-10-26 HISTORY — DX: Prediabetes: R73.03

## 2020-10-26 HISTORY — DX: Hyperlipidemia, unspecified: E78.5
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
## 2020-10-26 NOTE — Patient Instructions (Addendum)
Good to see you again today- take care and I will be in touch with your labs asap  I placed a referral for you to see GI again- you can call them if you like- Dr Collene Mares and Dr [facility] office  Address: [address], [place], [place] [address] Phone: [phone]  You can use the flexeril as needed for muscle pains- this will make you sleepy, don't use with alcohol We can also try sildenafil- generic viagra- as needed for ED.  You can take up to 100 mg/ 5 pills, but I would recommend starting with 40- 60 mg and see if this is adequate for you Let me know if any bothersome side effects   Great job with weight loss - your BP looks great!!

## 2020-11-01 ENCOUNTER — Encounter: Payer: Self-pay | Admitting: Family Medicine

## 2020-11-01 ENCOUNTER — Other Ambulatory Visit: Payer: Self-pay

## 2020-11-01 ENCOUNTER — Ambulatory Visit (INDEPENDENT_AMBULATORY_CARE_PROVIDER_SITE_OTHER): Payer: Managed Care, Other (non HMO) | Admitting: Family Medicine

## 2020-11-01 VITALS — BP 138/80 | HR 80 | Temp 97.9°F | Ht 72.0 in | Wt 275.0 lb

## 2020-11-01 DIAGNOSIS — E785 Hyperlipidemia, unspecified: Secondary | ICD-10-CM

## 2020-11-01 DIAGNOSIS — R141 Gas pain: Secondary | ICD-10-CM

## 2020-11-01 DIAGNOSIS — R7303 Prediabetes: Secondary | ICD-10-CM | POA: Diagnosis not present

## 2020-11-01 DIAGNOSIS — M62838 Other muscle spasm: Secondary | ICD-10-CM | POA: Diagnosis not present

## 2020-11-01 DIAGNOSIS — I10 Essential (primary) hypertension: Secondary | ICD-10-CM

## 2020-11-01 DIAGNOSIS — N529 Male erectile dysfunction, unspecified: Secondary | ICD-10-CM

## 2020-11-01 LAB — LIPID PANEL
Cholesterol: 207 mg/dL — ABNORMAL HIGH (ref 0–200)
HDL: 41.8 mg/dL (ref >39.00)
LDL Cholesterol: 128 mg/dL — ABNORMAL HIGH (ref 0–99)
NonHDL: 164.99
Total CHOL/HDL Ratio: 5
Triglycerides: 183 mg/dL — ABNORMAL HIGH (ref 0.0–149.0)
VLDL: 36.6 mg/dL (ref 0.0–40.0)

## 2020-11-01 LAB — BASIC METABOLIC PANEL
BUN: 16 mg/dL (ref 6–23)
CO2: 28 mEq/L (ref 19–32)
Calcium: 9.5 mg/dL (ref 8.4–10.5)
Chloride: 104 mEq/L (ref 96–112)
Creatinine, Ser: 0.81 mg/dL (ref 0.40–1.50)
GFR: 92.52 mL/min (ref >60.00)
Glucose, Bld: 105 mg/dL — ABNORMAL HIGH (ref 70–99)
Potassium: 4.8 mEq/L (ref 3.5–5.1)
Sodium: 140 mEq/L (ref 135–145)

## 2020-11-01 LAB — HEMOGLOBIN A1C: Hgb A1c MFr Bld: 6 % (ref 4.6–6.5)

## 2020-11-01 MED ORDER — SILDENAFIL CITRATE 20 MG PO TABS: ORAL_TABLET | ORAL | 0 refills | Status: DC

## 2020-11-01 MED ORDER — CYCLOBENZAPRINE HCL 10 MG PO TABS: 10.0000 mg | ORAL_TABLET | Freq: Two times a day (BID) | ORAL | 0 refills | Status: DC | PRN

## 2021-01-26 ENCOUNTER — Other Ambulatory Visit: Payer: Self-pay | Admitting: Family Medicine

## 2021-01-26 DIAGNOSIS — N529 Male erectile dysfunction, unspecified: Secondary | ICD-10-CM

## 2021-03-12 LAB — HM COLONOSCOPY

## 2021-05-04 ENCOUNTER — Other Ambulatory Visit: Payer: Self-pay | Admitting: Family Medicine

## 2021-05-04 DIAGNOSIS — I10 Essential (primary) hypertension: Secondary | ICD-10-CM

## 2021-06-05 ENCOUNTER — Other Ambulatory Visit: Payer: Self-pay

## 2021-06-05 ENCOUNTER — Telehealth: Payer: Self-pay | Admitting: Family Medicine

## 2021-06-05 DIAGNOSIS — I10 Essential (primary) hypertension: Secondary | ICD-10-CM

## 2021-06-05 MED ORDER — LOSARTAN POTASSIUM 50 MG PO TABS: 50.0000 mg | ORAL_TABLET | Freq: Every day | ORAL | 0 refills | Status: DC

## 2021-06-05 NOTE — Telephone Encounter (Signed)
Medication:  losartan (COZAAR) 50 MG tablet [[id number]]    Has the patient contacted their pharmacy? No. (If no, request that the patient contact the pharmacy for the refill.) (If yes, when and what did the pharmacy advise?)     Preferred Pharmacy (with phone number or street name):  [facility] ([address] [place]  [address] [place]) [place] [address]  Phone:  [phone]  Fax:  [phone]     Agent: Please be advised that RX refills may take up to 3 business days. We ask that you follow-up with your pharmacy.

## 2021-06-05 NOTE — Telephone Encounter (Signed)
30 day written, letter mailed for follow up.
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
## 2021-06-20 ENCOUNTER — Ambulatory Visit (INDEPENDENT_AMBULATORY_CARE_PROVIDER_SITE_OTHER): Payer: Managed Care, Other (non HMO) | Admitting: Family Medicine

## 2021-06-20 ENCOUNTER — Encounter: Payer: Self-pay | Admitting: Family Medicine

## 2021-06-20 VITALS — BP 144/78 | HR 70 | Resp 20 | Ht 72.0 in | Wt 276.4 lb

## 2021-06-20 DIAGNOSIS — E785 Hyperlipidemia, unspecified: Secondary | ICD-10-CM | POA: Diagnosis not present

## 2021-06-20 DIAGNOSIS — I10 Essential (primary) hypertension: Secondary | ICD-10-CM | POA: Diagnosis not present

## 2021-06-20 DIAGNOSIS — R7303 Prediabetes: Secondary | ICD-10-CM

## 2021-06-20 DIAGNOSIS — Z23 Encounter for immunization: Secondary | ICD-10-CM

## 2021-06-20 DIAGNOSIS — Z0000 Encounter for general adult medical examination without abnormal findings: Secondary | ICD-10-CM

## 2021-06-20 DIAGNOSIS — Z125 Encounter for screening for malignant neoplasm of prostate: Secondary | ICD-10-CM

## 2021-06-20 DIAGNOSIS — Z130 Encounter for screening for diseases of the blood and blood-forming organs and certain disorders involving the immune mechanism: Secondary | ICD-10-CM

## 2021-06-20 MED ORDER — LOSARTAN POTASSIUM 50 MG PO TABS: 50.0000 mg | ORAL_TABLET | Freq: Every day | ORAL | 3 refills | Status: DC

## 2021-06-20 NOTE — Patient Instructions (Addendum)
It was good to see you today- I will be in touch with your labs asap ?Tetanus booster today ?Please keep an eye on your BP at home- let me know if consistently higher than 135/85- in that case we may need to adjust your medication  ?

## 2021-06-21 ENCOUNTER — Encounter: Payer: Self-pay | Admitting: Family Medicine

## 2021-06-21 LAB — LIPID PANEL
Cholesterol: 198 mg/dL (ref 0–200)
HDL: 44.4 mg/dL (ref >39.00)
NonHDL: 153.18
Total CHOL/HDL Ratio: 4
Triglycerides: 293 mg/dL — ABNORMAL HIGH (ref 0.0–149.0)
VLDL: 58.6 mg/dL — ABNORMAL HIGH (ref 0.0–40.0)

## 2021-06-21 LAB — COMPREHENSIVE METABOLIC PANEL
ALT: 20 U/L (ref 0–53)
AST: 21 U/L (ref 0–37)
Albumin: 4.6 g/dL (ref 3.5–5.2)
Alkaline Phosphatase: 39 U/L (ref 39–117)
BUN: 15 mg/dL (ref 6–23)
CO2: 29 mEq/L (ref 19–32)
Calcium: 9.6 mg/dL (ref 8.4–10.5)
Chloride: 103 mEq/L (ref 96–112)
Creatinine, Ser: 0.78 mg/dL (ref 0.40–1.50)
GFR: 93.16 mL/min (ref >60.00)
Glucose, Bld: 108 mg/dL — ABNORMAL HIGH (ref 70–99)
Potassium: 4.1 mEq/L (ref 3.5–5.1)
Sodium: 139 mEq/L (ref 135–145)
Total Bilirubin: 0.4 mg/dL (ref 0.2–1.2)
Total Protein: 7.2 g/dL (ref 6.0–8.3)

## 2021-06-21 LAB — HEMOGLOBIN A1C: Hgb A1c MFr Bld: 6.1 % (ref 4.6–6.5)

## 2021-06-21 LAB — CBC
HCT: 38.1 % — ABNORMAL LOW (ref 39.0–52.0)
Hemoglobin: 13 g/dL (ref 13.0–17.0)
MCHC: 34.1 g/dL (ref 30.0–36.0)
MCV: 96.8 fl (ref 78.0–100.0)
Platelets: 252 10*3/uL (ref 150.0–400.0)
RBC: 3.94 Mil/uL — ABNORMAL LOW (ref 4.22–5.81)
RDW: 12.7 % (ref 11.5–15.5)
WBC: 6.3 10*3/uL (ref 4.0–10.5)

## 2021-06-21 LAB — PSA: PSA: 0.23 ng/mL (ref 0.10–4.00)

## 2021-06-21 LAB — LDL CHOLESTEROL, DIRECT: Direct LDL: 123 mg/dL

## 2021-09-17 ENCOUNTER — Other Ambulatory Visit: Payer: Self-pay | Admitting: Family Medicine

## 2021-09-17 DIAGNOSIS — M62838 Other muscle spasm: Secondary | ICD-10-CM

## 2021-11-28 DIAGNOSIS — H401110 Primary open-angle glaucoma, right eye, stage unspecified: Secondary | ICD-10-CM | POA: Diagnosis not present

## 2021-12-26 DIAGNOSIS — H401110 Primary open-angle glaucoma, right eye, stage unspecified: Secondary | ICD-10-CM | POA: Diagnosis not present

## 2022-02-25 ENCOUNTER — Other Ambulatory Visit: Payer: Self-pay | Admitting: Family Medicine

## 2022-02-25 DIAGNOSIS — N529 Male erectile dysfunction, unspecified: Secondary | ICD-10-CM

## 2022-03-05 DIAGNOSIS — H401112 Primary open-angle glaucoma, right eye, moderate stage: Secondary | ICD-10-CM | POA: Diagnosis not present

## 2022-04-18 DIAGNOSIS — Z08 Encounter for follow-up examination after completed treatment for malignant neoplasm: Secondary | ICD-10-CM | POA: Diagnosis not present

## 2022-04-18 DIAGNOSIS — L821 Other seborrheic keratosis: Secondary | ICD-10-CM | POA: Diagnosis not present

## 2022-04-18 DIAGNOSIS — D225 Melanocytic nevi of trunk: Secondary | ICD-10-CM | POA: Diagnosis not present

## 2022-04-18 DIAGNOSIS — L570 Actinic keratosis: Secondary | ICD-10-CM | POA: Diagnosis not present

## 2022-04-18 DIAGNOSIS — L814 Other melanin hyperpigmentation: Secondary | ICD-10-CM | POA: Diagnosis not present

## 2022-06-10 ENCOUNTER — Other Ambulatory Visit: Payer: Self-pay | Admitting: Family Medicine

## 2022-06-10 DIAGNOSIS — I10 Essential (primary) hypertension: Secondary | ICD-10-CM
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
## 2022-06-20 NOTE — Patient Instructions (Addendum)
It was good to see again today, I will be in touch with your lab work as soon as possible Recommend the shingles vaccine series at your convenience  Please stop at the imaging dept on the ground floor and we can schedule your CT coronary calcium Try to get more exercise- walking is ideal

## 2022-06-25 DIAGNOSIS — H401112 Primary open-angle glaucoma, right eye, moderate stage: Secondary | ICD-10-CM | POA: Diagnosis not present

## 2022-06-26 ENCOUNTER — Ambulatory Visit (INDEPENDENT_AMBULATORY_CARE_PROVIDER_SITE_OTHER): Payer: BC Managed Care – PPO | Admitting: Family Medicine

## 2022-06-26 ENCOUNTER — Encounter: Payer: Self-pay | Admitting: Family Medicine

## 2022-06-26 VITALS — BP 116/60 | HR 66 | Temp 97.6°F | Resp 18 | Ht 72.0 in | Wt 287.6 lb

## 2022-06-26 DIAGNOSIS — I10 Essential (primary) hypertension: Secondary | ICD-10-CM | POA: Diagnosis not present

## 2022-06-26 DIAGNOSIS — Z0000 Encounter for general adult medical examination without abnormal findings: Secondary | ICD-10-CM | POA: Diagnosis not present

## 2022-06-26 DIAGNOSIS — N529 Male erectile dysfunction, unspecified: Secondary | ICD-10-CM

## 2022-06-26 DIAGNOSIS — R5383 Other fatigue: Secondary | ICD-10-CM | POA: Diagnosis not present

## 2022-06-26 DIAGNOSIS — E785 Hyperlipidemia, unspecified: Secondary | ICD-10-CM

## 2022-06-26 DIAGNOSIS — Z130 Encounter for screening for diseases of the blood and blood-forming organs and certain disorders involving the immune mechanism: Secondary | ICD-10-CM | POA: Diagnosis not present

## 2022-06-26 DIAGNOSIS — R7303 Prediabetes: Secondary | ICD-10-CM | POA: Diagnosis not present

## 2022-06-26 DIAGNOSIS — Z125 Encounter for screening for malignant neoplasm of prostate: Secondary | ICD-10-CM | POA: Diagnosis not present

## 2022-06-26 LAB — COMPREHENSIVE METABOLIC PANEL
ALT: 25 U/L (ref 0–53)
AST: 23 U/L (ref 0–37)
Albumin: 4.3 g/dL (ref 3.5–5.2)
Alkaline Phosphatase: 37 U/L — ABNORMAL LOW (ref 39–117)
BUN: 15 mg/dL (ref 6–23)
CO2: 29 mEq/L (ref 19–32)
Calcium: 9.5 mg/dL (ref 8.4–10.5)
Chloride: 103 mEq/L (ref 96–112)
Creatinine, Ser: 0.75 mg/dL (ref 0.40–1.50)
GFR: 93.6 mL/min (ref >60.00)
Glucose, Bld: 115 mg/dL — ABNORMAL HIGH (ref 70–99)
Potassium: 4.4 mEq/L (ref 3.5–5.1)
Sodium: 138 mEq/L (ref 135–145)
Total Bilirubin: 0.6 mg/dL (ref 0.2–1.2)
Total Protein: 7.1 g/dL (ref 6.0–8.3)

## 2022-06-26 LAB — LIPID PANEL
Cholesterol: 200 mg/dL (ref 0–200)
HDL: 39.3 mg/dL (ref >39.00)
NonHDL: 160.81
Total CHOL/HDL Ratio: 5
Triglycerides: 289 mg/dL — ABNORMAL HIGH (ref 0.0–149.0)
VLDL: 57.8 mg/dL — ABNORMAL HIGH (ref 0.0–40.0)

## 2022-06-26 LAB — CBC
HCT: 39.7 % (ref 39.0–52.0)
Hemoglobin: 13.4 g/dL (ref 13.0–17.0)
MCHC: 33.8 g/dL (ref 30.0–36.0)
MCV: 97 fl (ref 78.0–100.0)
Platelets: 252 10*3/uL (ref 150.0–400.0)
RBC: 4.09 Mil/uL — ABNORMAL LOW (ref 4.22–5.81)
RDW: 13 % (ref 11.5–15.5)
WBC: 5.8 10*3/uL (ref 4.0–10.5)

## 2022-06-26 LAB — PSA: PSA: 0.39 ng/mL (ref 0.10–4.00)

## 2022-06-26 LAB — LDL CHOLESTEROL, DIRECT: Direct LDL: 110 mg/dL

## 2022-06-26 LAB — HEMOGLOBIN A1C: Hgb A1c MFr Bld: 6.2 % (ref 4.6–6.5)

## 2022-06-26 MED ORDER — LOSARTAN POTASSIUM 50 MG PO TABS: ORAL_TABLET | ORAL | 3 refills | Status: DC

## 2022-06-27 ENCOUNTER — Encounter: Payer: Self-pay | Admitting: Family Medicine

## 2022-06-27 LAB — TESTOSTERONE TOTAL,FREE,BIO, MALES
Albumin: 4.5 g/dL (ref 3.6–5.1)
Sex Hormone Binding: 54 nmol/L (ref 22–77)
Testosterone, Bioavailable: 66.2 ng/dL — ABNORMAL LOW (ref 110.0–575.0)
Testosterone, Free: 32.2 pg/mL — ABNORMAL LOW (ref 46.0–224.0)
Testosterone: 379 ng/dL (ref 250–827)

## 2022-07-05 ENCOUNTER — Other Ambulatory Visit (HOSPITAL_BASED_OUTPATIENT_CLINIC_OR_DEPARTMENT_OTHER): Payer: BC Managed Care – PPO

## 2022-07-15 ENCOUNTER — Encounter: Payer: Self-pay | Admitting: Family Medicine

## 2022-07-15 ENCOUNTER — Ambulatory Visit (HOSPITAL_BASED_OUTPATIENT_CLINIC_OR_DEPARTMENT_OTHER)
Admission: RE | Admit: 2022-07-15 | Discharge: 2022-07-15 | Disposition: A | Discharge status: Home / Self Care | Payer: BC Managed Care – PPO | Source: Ambulatory Visit | Attending: Family Medicine | Admitting: Family Medicine

## 2022-07-15 DIAGNOSIS — I77810 Thoracic aortic ectasia: Secondary | ICD-10-CM

## 2022-07-15 DIAGNOSIS — R5383 Other fatigue: Secondary | ICD-10-CM | POA: Insufficient documentation

## 2022-07-15 DIAGNOSIS — I10 Essential (primary) hypertension: Secondary | ICD-10-CM | POA: Insufficient documentation

## 2022-07-15 DIAGNOSIS — E785 Hyperlipidemia, unspecified: Secondary | ICD-10-CM | POA: Insufficient documentation

## 2022-07-15 HISTORY — DX: Thoracic aortic ectasia: I77.810

## 2022-08-08 DIAGNOSIS — H524 Presbyopia: Secondary | ICD-10-CM | POA: Diagnosis not present

## 2022-09-04 ENCOUNTER — Other Ambulatory Visit: Payer: Self-pay | Admitting: Family Medicine

## 2022-09-04 DIAGNOSIS — M62838 Other muscle spasm: Secondary | ICD-10-CM

## 2022-11-19 DIAGNOSIS — H524 Presbyopia: Secondary | ICD-10-CM | POA: Diagnosis not present

## 2022-12-12 DIAGNOSIS — K080 Exfoliation of teeth due to systemic causes: Secondary | ICD-10-CM | POA: Diagnosis not present

## 2023-04-21 DIAGNOSIS — L821 Other seborrheic keratosis: Secondary | ICD-10-CM | POA: Diagnosis not present

## 2023-04-21 DIAGNOSIS — L570 Actinic keratosis: Secondary | ICD-10-CM | POA: Diagnosis not present

## 2023-04-21 DIAGNOSIS — L280 Lichen simplex chronicus: Secondary | ICD-10-CM | POA: Diagnosis not present

## 2023-04-21 DIAGNOSIS — D225 Melanocytic nevi of trunk: Secondary | ICD-10-CM | POA: Diagnosis not present

## 2023-04-21 DIAGNOSIS — Z08 Encounter for follow-up examination after completed treatment for malignant neoplasm: Secondary | ICD-10-CM | POA: Diagnosis not present

## 2023-04-21 DIAGNOSIS — L814 Other melanin hyperpigmentation: Secondary | ICD-10-CM | POA: Diagnosis not present

## 2023-04-21 DIAGNOSIS — D492 Neoplasm of unspecified behavior of bone, soft tissue, and skin: Secondary | ICD-10-CM | POA: Diagnosis not present

## 2023-04-21 DIAGNOSIS — L905 Scar conditions and fibrosis of skin: Secondary | ICD-10-CM | POA: Diagnosis not present

## 2023-05-05 NOTE — Patient Instructions (Signed)
It was good to see you today, I will be in touch with your blood work as soon as possible Recommend shingles vaccine series at your pharmacy-Shingrix Also recommend seasonal flu shot  Try the omeprazole 40 mg daily for 2-4 weeks for gassiness- let me know how this works for you I will also refer you to cardiology to discuss your coronary calcium CT.  I suspect they will recommend cholesterol medication and a repeat scan of your aorta this spring

## 2023-05-05 NOTE — Progress Notes (Unsigned)
[facility] at [facility] [address], [address] [place], [place] [address] [phone]  Date:  05/07/2023   Name:  William Leach   DOB:  03-04-1956   MRN:  [id number]  PCP:  Pearline Cables, MD    Chief Complaint: No chief complaint on file.   History of Present Illness:  William Leach is a 68 y.o. very pleasant male patient who presents with the following:  Patient seen today for physical exam Most recent visit with myself was in March 2024 also for his physical-at that time he was looking forward to retirement in 1 month  History of hypertension, dyslipidemia, prediabetes, glaucoma, skin cancer He is using some drops for glaucoma per his optho- Dr Bonnetta Barry manages his glaucoma   Colon cancer screening up-to-date, he is screened every 5 years Blood work last year showed mildly low free and bioavailable T levels, I ordered a repeat level but he did not end up having this done Has declined flu and shingles vaccines  He did do a CT coronary calcium April 2024 IMPRESSION: Coronary calcium score of 856. This was 52 percentile for age-, race-, and sex-matched controls.  -I recommended starting a statin and offered cardiologist referral -He was also noted to have minimal aortic dilation at 42 mm He did not get back with me about these recommendations as of yet  Recommend a statin Per the American Association of thoracic surgeons guidelines-  Aortic Imaging Surveillance In patients with initial aortic dilatation (root or tubular ascending aorta 40-49 mm), the thoracic aorta should be reimaged at 12 months. If stability is confirmed, then reimaging can be performed every 2 or 3 years (Table 2).    Patient Active Problem List   Diagnosis Date Noted   Aortic root dilatation (HCC) 07/15/2022   Hypertension, essential 10/26/2020   Prediabetes 10/26/2020   Dyslipidemia 10/26/2020   Tubular adenoma of colon 01/26/2016    Past  Medical History:  Diagnosis Date   Arthritis     Past Surgical History:  Procedure Laterality Date   ELBOW SURGERY Right    25 YEARS AGO    Social History   Tobacco Use   Smoking status: Former    Types: Cigarettes   Smokeless tobacco: Never    Family History  Problem Relation Age of Onset   Diabetes Father     No Known Allergies  Medication list has been reviewed and updated.  Current Outpatient Medications on File Prior to Visit  Medication Sig Dispense Refill   acetaminophen (TYLENOL) 500 MG tablet Take 1,000 mg by mouth every 6 (six) hours as needed.     aspirin EC 81 MG tablet Take 81 mg by mouth daily. Swallow whole.     cyclobenzaprine (FLEXERIL) 10 MG tablet TAKE 1 TABLET BY MOUTH TWICE DAILY AS NEEDED FOR MUSCLE SPASM 30 tablet 0   losartan (COZAAR) 50 MG tablet TAKE 1 TABLET BY MOUTH ONCE DAILY . 90 tablet 3   sildenafil (REVATIO) 20 MG tablet TAKE 1 TO 5 TABLETS BY MOUTH ONCE DAILY AS NEEDED FOR ERECTILE DYSFUNCTION 30 tablet 3   No current facility-administered medications on file prior to visit.    Review of Systems:  As per HPI- otherwise negative.   Physical Examination: There were no vitals filed for this visit. There were no vitals filed for this visit. There is no height or weight on file to calculate BMI. Ideal Body Weight:  GEN: no acute distress. HEENT: Atraumatic, Normocephalic.  Ears and Nose: No external deformity. CV: RRR, No M/G/R. No JVD. No thrill. No extra heart sounds. PULM: CTA B, no wheezes, crackles, rhonchi. No retractions. No resp. distress. No accessory muscle use. ABD: S, NT, ND, +BS. No rebound. No HSM. EXTR: No c/c/e PSYCH: Normally interactive. Conversant.    Assessment and Plan: *** Physical exam today.  Encouraged healthy diet and exercise routine Will plan further follow- up pending labs.  Signed Abbe Amsterdam, MD

## 2023-05-07 ENCOUNTER — Encounter: Payer: Self-pay | Admitting: Family Medicine

## 2023-05-07 ENCOUNTER — Ambulatory Visit (INDEPENDENT_AMBULATORY_CARE_PROVIDER_SITE_OTHER): Payer: Medicare Other | Admitting: Family Medicine

## 2023-05-07 VITALS — BP 130/82 | HR 69 | Temp 98.2°F | Ht 72.0 in | Wt 289.6 lb

## 2023-05-07 DIAGNOSIS — Z130 Encounter for screening for diseases of the blood and blood-forming organs and certain disorders involving the immune mechanism: Secondary | ICD-10-CM | POA: Diagnosis not present

## 2023-05-07 DIAGNOSIS — E785 Hyperlipidemia, unspecified: Secondary | ICD-10-CM

## 2023-05-07 DIAGNOSIS — Z125 Encounter for screening for malignant neoplasm of prostate: Secondary | ICD-10-CM | POA: Diagnosis not present

## 2023-05-07 DIAGNOSIS — Z0000 Encounter for general adult medical examination without abnormal findings: Secondary | ICD-10-CM | POA: Diagnosis not present

## 2023-05-07 DIAGNOSIS — I10 Essential (primary) hypertension: Secondary | ICD-10-CM

## 2023-05-07 DIAGNOSIS — R7303 Prediabetes: Secondary | ICD-10-CM

## 2023-05-07 DIAGNOSIS — R931 Abnormal findings on diagnostic imaging of heart and coronary circulation: Secondary | ICD-10-CM

## 2023-05-07 DIAGNOSIS — R140 Abdominal distension (gaseous): Secondary | ICD-10-CM | POA: Diagnosis not present

## 2023-05-07 LAB — COMPREHENSIVE METABOLIC PANEL
ALT: 21 U/L (ref 0–53)
AST: 21 U/L (ref 0–37)
Albumin: 4.4 g/dL (ref 3.5–5.2)
Alkaline Phosphatase: 37 U/L — ABNORMAL LOW (ref 39–117)
BUN: 16 mg/dL (ref 6–23)
CO2: 31 meq/L (ref 19–32)
Calcium: 9.5 mg/dL (ref 8.4–10.5)
Chloride: 103 meq/L (ref 96–112)
Creatinine, Ser: 0.83 mg/dL (ref 0.40–1.50)
GFR: 90.23 mL/min (ref >60.00)
Glucose, Bld: 107 mg/dL — ABNORMAL HIGH (ref 70–99)
Potassium: 4.7 meq/L (ref 3.5–5.1)
Sodium: 138 meq/L (ref 135–145)
Total Bilirubin: 0.6 mg/dL (ref 0.2–1.2)
Total Protein: 7 g/dL (ref 6.0–8.3)

## 2023-05-07 LAB — PSA: PSA: 0.4 ng/mL (ref 0.10–4.00)

## 2023-05-07 LAB — CBC
HCT: 40.4 % (ref 39.0–52.0)
Hemoglobin: 13.5 g/dL (ref 13.0–17.0)
MCHC: 33.4 g/dL (ref 30.0–36.0)
MCV: 98.4 fL (ref 78.0–100.0)
Platelets: 242 10*3/uL (ref 150.0–400.0)
RBC: 4.11 Mil/uL — ABNORMAL LOW (ref 4.22–5.81)
RDW: 12.7 % (ref 11.5–15.5)
WBC: 6.3 10*3/uL (ref 4.0–10.5)

## 2023-05-07 LAB — LIPID PANEL
Cholesterol: 192 mg/dL (ref 0–200)
HDL: 43.4 mg/dL (ref >39.00)
LDL Cholesterol: 110 mg/dL — ABNORMAL HIGH (ref 0–99)
NonHDL: 148.23
Total CHOL/HDL Ratio: 4
Triglycerides: 189 mg/dL — ABNORMAL HIGH (ref 0.0–149.0)
VLDL: 37.8 mg/dL (ref 0.0–40.0)

## 2023-05-07 LAB — LIPASE: Lipase: 8 U/L — ABNORMAL LOW (ref 11.0–59.0)

## 2023-05-07 LAB — HEMOGLOBIN A1C: Hgb A1c MFr Bld: 6.3 % (ref 4.6–6.5)

## 2023-05-07 MED ORDER — LOSARTAN POTASSIUM 50 MG PO TABS: ORAL_TABLET | ORAL | 3 refills | Status: DC

## 2023-05-07 MED ORDER — OMEPRAZOLE 40 MG PO CPDR: 40.0000 mg | DELAYED_RELEASE_CAPSULE | Freq: Every day | ORAL | 3 refills | Status: DC

## 2023-06-30 ENCOUNTER — Other Ambulatory Visit: Payer: Self-pay

## 2023-06-30 DIAGNOSIS — M1990 Unspecified osteoarthritis, unspecified site: Secondary | ICD-10-CM | POA: Insufficient documentation

## 2023-07-08 ENCOUNTER — Encounter: Payer: Self-pay | Admitting: Cardiology

## 2023-07-08 ENCOUNTER — Ambulatory Visit: Payer: Medicare Other | Attending: Cardiology | Admitting: Cardiology

## 2023-07-08 VITALS — BP 128/78 | HR 58 | Ht 72.0 in | Wt 280.0 lb

## 2023-07-08 DIAGNOSIS — I10 Essential (primary) hypertension: Secondary | ICD-10-CM | POA: Diagnosis not present

## 2023-07-08 DIAGNOSIS — I700 Atherosclerosis of aorta: Secondary | ICD-10-CM | POA: Insufficient documentation

## 2023-07-08 DIAGNOSIS — E669 Obesity, unspecified: Secondary | ICD-10-CM | POA: Insufficient documentation

## 2023-07-08 DIAGNOSIS — I2510 Atherosclerotic heart disease of native coronary artery without angina pectoris: Secondary | ICD-10-CM | POA: Insufficient documentation

## 2023-07-08 DIAGNOSIS — R0609 Other forms of dyspnea: Secondary | ICD-10-CM | POA: Insufficient documentation

## 2023-07-08 DIAGNOSIS — E785 Hyperlipidemia, unspecified: Secondary | ICD-10-CM | POA: Diagnosis not present

## 2023-07-08 DIAGNOSIS — I77810 Thoracic aortic ectasia: Secondary | ICD-10-CM

## 2023-07-08 MED ORDER — ROSUVASTATIN CALCIUM 10 MG PO TABS: 10.0000 mg | ORAL_TABLET | Freq: Every day | ORAL | 3 refills | Status: AC

## 2023-07-08 NOTE — Patient Instructions (Signed)
 Medication Instructions:  Your physician has recommended you make the following change in your medication:   Start taking 81 mg coated aspirin daily  Start Rosuvastatin 10 mg daily  *If you need a refill on your cardiac medications before your next appointment, please call your pharmacy*   Lab Work: Your physician recommends that you return for lab work in: the 6 weeks for CMP and lipids. You need to have labs done when you are fasting. [facility] lab is located on the 3rd floor, [address]. Hours are Monday - Friday 8 am to 4 pm, closed 11:30 am to 1:00 pm. You do NOT need an appointment.   If you have labs (blood work) drawn today and your tests are completely normal, you will receive your results only by: MyChart Message (if you have MyChart) OR A paper copy in the mail If you have any lab test that is abnormal or we need to change your treatment, we will call you to review the results.   Testing/Procedures:   [facility] at [facility] [address] [place], [place] [address] Phone: [phone]   Stress Echocardiogram Instructions:    1. You may take all of your medications.  2. No food, drink or tobacco products four hours prior to your test.  3. Dress prepared to exercise. Best to wear 2 piece outfit and tennis shoes. Shoes must be closed toe.  4. Please bring all current prescription medications.  Please report to [address], [address] for your test.  If you have questions or concerns about your appointment, you can call the Nuclear Lab at [phone].  If you cannot keep your appointment, please provide 24 hours notification to the Nuclear Lab, to avoid a possible $50 charge to your account.   Follow-Up: At [facility], you and your health needs are our priority.  As part of our continuing mission to provide you with exceptional heart care, we have created designated Provider Care Teams.  These Care  Teams include your primary Cardiologist (physician) and Advanced Practice Providers (APPs -  Physician Assistants and Nurse Practitioners) who all work together to provide you with the care you need, when you need it.  We recommend signing up for the patient portal called "MyChart".  Sign up information is provided on this After Visit Summary.  MyChart is used to connect with patients for Virtual Visits (Telemedicine).  Patients are able to view lab/test results, encounter notes, upcoming appointments, etc.  Non-urgent messages can be sent to your provider as well.   To learn more about what you can do with MyChart, go to [email or web address].    Your next appointment:   12 month(s)  The format for your next appointment:   In Person  Provider:   Belva Crome, MD    Other Instructions Exercise Stress Echocardiogram An exercise stress echocardiogram is a test to check how well your heart is working. This test uses sound waves--often called ultrasound--and a computer to make pictures of your heart. These pictures will be made before and after you exercise. For this test, you'll walk on a treadmill or ride an exercise bike to make your heart beat faster. While you exercise, your heart rate and rhythm will be checked with an electrocardiogram, or ECG. You may have this test if: You have chest pain or other symptoms of a heart problem. You had a heart attack or heart surgery not long ago. You have heart valve problems. You  have fast or uneven heartbeats. These are called palpitations. You have problems from heart failure. Tell a health care provider about: Any allergies you have. All medicines you are taking. These include vitamins, herbs, eye drops, creams, and over-the-counter medicines. Any problems you or family members have had with anesthesia. Any bleeding problems you have. Any surgeries you have had. Any medical conditions you have. Whether you are pregnant or may be  pregnant. What are the risks? Your health care provider will talk with you about risks. These may include: Chest pain. Feeling dizzy or light-headed. Shortness of breath. Fast or uneven heartbeats. Vomiting or feeling like you may vomit. Heart attack. This is very rare. What happens before the test? Medicines Ask about changing or stopping any medicines you take. If you use an inhaler, bring it with you to the test. General instructions Wear comfortable clothes and walking shoes. Follow instructions about what you may eat and drink. Do not drink or eat anything that has caffeine in it. Stop having caffeine 24 hours before the test. Do not smoke, vape, or use any products that have nicotine or tobacco for at least 4 hours before the test. If you need help quitting, talk with your provider. What happens during the test?  You'll take off your clothes from the waist up and put on a hospital gown. Sticky patches called electrodes will be put on your body. These will be connected to the ECG machine. A blood pressure cuff will be put on your arm. Before you exercise, you'll have your first heart test. You'll lie down. A wand covered in gel will be moved over your chest. Sound waves from the wand will go to the computer to make the pictures. Then, you'll start to exercise. You may walk on a treadmill or pedal a bike. Your blood pressure and heart rate will be checked while you exercise. The exercise will get harder or faster. You'll exercise until: Your heart rate reaches a certain level. You're too tired to go on. You cannot go on because of chest pain, weakness, or feeling dizzy. You'll lie down right away so more pictures of your heart can be made. The test may vary among providers and hospitals. What happens after the test? You'll be watched closely until you leave. This includes checking your blood pressure, heart rate, breathing rate, and blood oxygen level. Ask when your test  results will be ready and how to get them. You may need to call or meet with your provider to discuss your results. This information is not intended to replace advice given to you by your health care provider. Make sure you discuss any questions you have with your health care provider. Document Revised: 12/26/2022 Document Reviewed: 05/30/2022 Elsevier Patient Education  2024 Elsevier Inc.  Important Information About Sugar

## 2023-07-08 NOTE — Progress Notes (Signed)
 Cardiology Office Note:    Date:  07/08/2023   ID:  William Leach, DOB Sep 26, 1955, MRN [id number]  PCP:  Pearline Cables, MD  Cardiologist:  Garwin Brothers, MD   Referring MD: Pearline Cables, MD    ASSESSMENT:    1. Dyslipidemia   2. Hypertension, essential   3. Aortic atherosclerosis (HCC)   4. Coronary artery calcification   5. Obesity (BMI 35.0-39.9 without comorbidity)   6. DOE (dyspnea on exertion)   7. Aortic root dilatation (HCC)    PLAN:    In order of problems listed above:  Secondary prevention stressed with the patient.  Importance of compliance with diet medication stressed and patient verbalized standing. Coronary artery calcification and aortic root calcification: This is significant.  I discussed this with the patient at length.  I will initiate him on statin therapy.  He is agreeable.  Benefits risks explained. Mixed dyslipidemia: On lipid-lowering medications that will be initiated now and we will follow-up with blood work in 6 weeks.  Diet emphasized. Dyspnea on exertion: In view of the above noted well exercise stress echo.  He is agreeable.  If this is negative he was advised to initiate a graded exercise program. Obesity: Weight reduction stressed.  Diet emphasized.  Risks of obesity explained.  He promises to do better. Aortic dilatation: This will need to be monitored on a annual basis.  Educated him about symptoms of dyspnea. Patient will be seen in follow-up appointment in 6 months or earlier if the patient has any concerns.    Medication Adjustments/Labs and Tests Ordered: Current medicines are reviewed at length with the patient today.  Concerns regarding medicines are outlined above.  Orders Placed This Encounter  Procedures   Comprehensive metabolic panel with GFR   Lipid panel   EKG 12-Lead   ECHOCARDIOGRAM STRESS TEST   Meds ordered this encounter  Medications   rosuvastatin (CRESTOR) 10 MG tablet    Sig: Take 1 tablet (10 mg  total) by mouth daily.    Dispense:  90 tablet    Refill:  3     History of Present Illness:    William Leach is a 68 y.o. male who is being seen today for the evaluation of coronary artery calcification and dyspnea on exertion at the request of Copland, Gwenlyn Found, MD. patient is a pleasant 68 year old male.  He has past medical history of recently diagnosed aortic root dilatation, elevated calcium score.  He leads a sedentary lifestyle.  He also has mixed dyslipidemia and obesity.  He denies any chest pain orthopnea or PND.  He leads a sedentary lifestyle.  He has some dyspnea on exertion.  At the time of my evaluation, the patient is alert awake oriented and in no distress.  Past Medical History:  Diagnosis Date   Aortic root dilatation (HCC) 07/15/2022   4.2 cm noted on CT coronary calcium 4/24     Arthritis    Dyslipidemia 10/26/2020   Hypertension, essential 10/26/2020   Prediabetes 10/26/2020   Tubular adenoma of colon 01/26/2016    Past Surgical History:  Procedure Laterality Date   ELBOW SURGERY Right    25 YEARS AGO    Current Medications: Current Meds  Medication Sig   acetaminophen (TYLENOL) 500 MG tablet Take 1,000 mg by mouth every 6 (six) hours as needed for mild pain (pain score 1-3) or moderate pain (pain score 4-6).   aspirin EC 81 MG tablet Take 81 mg by mouth  daily. Swallow whole.   cyclobenzaprine (FLEXERIL) 10 MG tablet TAKE 1 TABLET BY MOUTH TWICE DAILY AS NEEDED FOR MUSCLE SPASM   losartan (COZAAR) 50 MG tablet TAKE 1 TABLET BY MOUTH ONCE DAILY .   omeprazole (PRILOSEC) 40 MG capsule Take 1 capsule (40 mg total) by mouth daily. Take daily for 2-4 weeks   rosuvastatin (CRESTOR) 10 MG tablet Take 1 tablet (10 mg total) by mouth daily.   sildenafil (REVATIO) 20 MG tablet TAKE 1 TO 5 TABLETS BY MOUTH ONCE DAILY AS NEEDED FOR ERECTILE DYSFUNCTION     Allergies:   Patient has no known allergies.   Social History   Socioeconomic History   Marital  status: Married    Spouse name: Not on file   Number of children: Not on file   Years of education: Not on file   Highest education level: Not on file  Occupational History   Not on file  Tobacco Use   Smoking status: Former    Types: Cigarettes   Smokeless tobacco: Never  Substance and Sexual Activity   Alcohol use: Not on file   Drug use: Not on file   Sexual activity: Not on file  Other Topics Concern   Not on file  Social History Narrative   Not on file   Social Drivers of Health   Financial Resource Strain: Not on file  Food Insecurity: Not on file  Transportation Needs: Not on file  Physical Activity: Not on file  Stress: Not on file  Social Connections: Not on file     Family History: The patient's family history includes Diabetes in his father.  ROS:   Please see the history of present illness.    All other systems reviewed and are negative.  EKGs/Labs/Other Studies Reviewed:    The following studies were reviewed today:  EKG Interpretation Date/Time:  Tuesday July 08 2023 08:35:21 EDT Ventricular Rate:  58 PR Interval:  232 QRS Duration:  86 QT Interval:  430 QTC Calculation: 422 R Axis:   -20  Text Interpretation: Sinus bradycardia with 1st degree A-V block Minimal voltage criteria for LVH, may be normal variant ( R in aVL ) Possible Anterior infarct , age undetermined No previous ECGs available Confirmed by Belva Crome [phone]) on 07/08/2023 8:44:21 AM     Recent Labs: 05/07/2023: ALT 21; BUN 16; Creatinine, Ser 0.83; Hemoglobin 13.5; Platelets 242.0; Potassium 4.7; Sodium 138  Recent Lipid Panel    Component Value Date/Time   CHOL 192 05/07/2023 0842   CHOL 191 04/27/2019 1621   TRIG 189.0 (H) 05/07/2023 0842   HDL 43.40 05/07/2023 0842   HDL 34 (L) 04/27/2019 1621   CHOLHDL 4 05/07/2023 0842   VLDL 37.8 05/07/2023 0842   LDLCALC 110 (H) 05/07/2023 0842   LDLCALC 97 04/27/2019 1621   LDLDIRECT 110.0 06/26/2022 0844    Physical Exam:     VS:  BP 128/78   Pulse (!) 58   Ht 6' (1.829 m)   Wt 280 lb (127 kg)   SpO2 98%   BMI 37.97 kg/m     Wt Readings from Last 3 Encounters:  07/08/23 280 lb (127 kg)  05/07/23 289 lb 9.6 oz (131.4 kg)  06/26/22 287 lb 9.6 oz (130.5 kg)     GEN: Patient is in no acute distress HEENT: Normal NECK: No JVD; No carotid bruits LYMPHATICS: No lymphadenopathy CARDIAC: S1 S2 regular, 2/6 systolic murmur at the apex. RESPIRATORY:  Clear to auscultation without rales, wheezing  or rhonchi  ABDOMEN: Soft, non-tender, non-distended MUSCULOSKELETAL:  No edema; No deformity  SKIN: Warm and dry NEUROLOGIC:  Alert and oriented x 3 PSYCHIATRIC:  Normal affect    Signed, Garwin Brothers, MD  07/08/2023 9:17 AM    [facility]

## 2023-07-12 ENCOUNTER — Other Ambulatory Visit: Payer: Self-pay | Admitting: Family Medicine

## 2023-07-12 DIAGNOSIS — M62838 Other muscle spasm: Secondary | ICD-10-CM

## 2023-07-12 DIAGNOSIS — N529 Male erectile dysfunction, unspecified: Secondary | ICD-10-CM

## 2023-07-16 DIAGNOSIS — H401112 Primary open-angle glaucoma, right eye, moderate stage: Secondary | ICD-10-CM | POA: Diagnosis not present

## 2023-07-16 DIAGNOSIS — H2513 Age-related nuclear cataract, bilateral: Secondary | ICD-10-CM | POA: Diagnosis not present

## 2023-07-23 ENCOUNTER — Telehealth (HOSPITAL_COMMUNITY): Payer: Self-pay | Admitting: *Deleted

## 2023-07-23 NOTE — Telephone Encounter (Signed)
 Left message on voicemail per DPR in reference to upcoming appointment scheduled on 07/30/2023 at 1:45 with detailed instructions given per Stress Test Requisition Sheet for the test. LM to arrive 30 minutes early, and that it is imperative to arrive on time for appointment to keep from having the test rescheduled. If you need to cancel or reschedule your appointment, please call the office within 24 hours of your appointment. Failure to do so may result in a cancellation of your appointment, and a $50 no show fee. Phone number given for call back for any questions. Anne Barrios

## 2023-07-30 ENCOUNTER — Ambulatory Visit: Attending: Cardiology

## 2023-07-30 ENCOUNTER — Ambulatory Visit

## 2023-07-30 DIAGNOSIS — R0609 Other forms of dyspnea: Secondary | ICD-10-CM

## 2023-09-04 ENCOUNTER — Ambulatory Visit

## 2023-09-04 VITALS — BP 128/78 | Ht 72.0 in | Wt 278.0 lb

## 2023-09-04 DIAGNOSIS — Z2821 Immunization not carried out because of patient refusal: Secondary | ICD-10-CM | POA: Diagnosis not present

## 2023-09-04 DIAGNOSIS — Z0000 Encounter for general adult medical examination without abnormal findings: Secondary | ICD-10-CM

## 2023-09-04 NOTE — Progress Notes (Signed)
 Because this visit was a virtual/telehealth visit,  certain criteria was not obtained, such a blood pressure, CBG if applicable, and timed get up and go. Any medications not marked as "taking" were not mentioned during the medication reconciliation part of the visit. Any vitals not documented were not able to be obtained due to this being a telehealth visit or patient was unable to self-report a recent blood pressure reading due to a lack of equipment at home via telehealth. Vitals that have been documented are verbally provided by the patient.   This visit was performed by a medical professional under my direct supervision. I was immediately available for consultation/collaboration. I have reviewed and agree with the Annual Wellness Visit documentation.  Subjective:   William Leach is a 68 y.o. who presents for a Medicare Wellness preventive visit.  As a reminder, Annual Wellness Visits don't include a physical exam, and some assessments may be limited, especially if this visit is performed virtually. We may recommend an in-person follow-up visit with your provider if needed.  Visit Complete: Virtual I connected with  William Leach on 09/04/23 by a audio enabled telemedicine application and verified that I am speaking with the correct person using two identifiers.  Patient Location: Home  Provider Location: Home Office  I discussed the limitations of evaluation and management by telemedicine. The patient expressed understanding and agreed to proceed.  Vital Signs: Because this visit was a virtual/telehealth visit, some criteria may be missing or patient reported. Any vitals not documented were not able to be obtained and vitals that have been documented are patient reported.  VideoDeclined- This patient declined Librarian, academic. Therefore the visit was completed with audio only.  Persons Participating in Visit: Patient.  AWV Questionnaire: No: Patient  Medicare AWV questionnaire was not completed prior to this visit.  Cardiac Risk Factors include: advanced age (>59men, >62 women);male gender;hypertension;obesity (BMI >30kg/m2);dyslipidemia     Objective:     Today's Vitals   09/04/23 0816  BP: 128/78  Weight: 278 lb (126.1 kg)  Height: 6' (1.829 m)   Body mass index is 37.7 kg/m.     09/04/2023    8:20 AM 01/12/2016    1:11 PM  Advanced Directives  Does Patient Have a Medical Advance Directive? Yes Yes  Type of Estate agent of [place];Living will Healthcare Power of [place];Living will  Copy of Healthcare Power of Attorney in Chart? No - copy requested   Would patient like information on creating a medical advance directive? No - Patient declined     Current Medications (verified) Outpatient Encounter Medications as of 09/04/2023  Medication Sig   acetaminophen (TYLENOL) 500 MG tablet Take 1,000 mg by mouth every 6 (six) hours as needed for mild pain (pain score 1-3) or moderate pain (pain score 4-6).   aspirin EC 81 MG tablet Take 81 mg by mouth daily. Swallow whole.   cyclobenzaprine  (FLEXERIL ) 10 MG tablet TAKE 1 TABLET BY MOUTH TWICE DAILY AS NEEDED FOR MUSCLE SPASM   losartan  (COZAAR ) 50 MG tablet TAKE 1 TABLET BY MOUTH ONCE DAILY .   rosuvastatin  (CRESTOR ) 10 MG tablet Take 1 tablet (10 mg total) by mouth daily.   sildenafil  (REVATIO ) 20 MG tablet TAKE 1 TO 5 TABLETS BY MOUTH ONCE DAILY AS NEEDED FOR ERECTILE DYSFUNCTION   omeprazole  (PRILOSEC) 40 MG capsule Take 1 capsule (40 mg total) by mouth daily. Take daily for 2-4 weeks   No facility-administered encounter medications on file  as of 09/04/2023.    Allergies (verified) Patient has no known allergies.   History: Past Medical History:  Diagnosis Date   Aortic root dilatation (HCC) 07/15/2022   4.2 cm noted on CT coronary calcium  4/24     Arthritis    Dyslipidemia 10/26/2020   Hypertension, essential 10/26/2020   Prediabetes  10/26/2020   Tubular adenoma of colon 01/26/2016   Past Surgical History:  Procedure Laterality Date   ELBOW SURGERY Right    25 YEARS AGO   Family History  Problem Relation Age of Onset   Diabetes Father    Social History   Socioeconomic History   Marital status: Married    Spouse name: Not on file   Number of children: Not on file   Years of education: Not on file   Highest education level: Not on file  Occupational History   Not on file  Tobacco Use   Smoking status: Former    Types: Cigarettes   Smokeless tobacco: Never  Substance and Sexual Activity   Alcohol use: Not on file   Drug use: Not on file   Sexual activity: Not on file  Other Topics Concern   Not on file  Social History Narrative   Not on file   Social Drivers of Health   Financial Resource Strain: Low Risk  (09/04/2023)   Overall Financial Resource Strain (CARDIA)    Difficulty of Paying Living Expenses: Not hard at all  Food Insecurity: No Food Insecurity (09/04/2023)   Hunger Vital Sign    Worried About Running Out of Food in the Last Year: Never true    Ran Out of Food in the Last Year: Never true  Transportation Needs: No Transportation Needs (09/04/2023)   PRAPARE - Administrator, Civil Service (Medical): No    Lack of Transportation (Non-Medical): No  Physical Activity: Insufficiently Active (09/04/2023)   Exercise Vital Sign    Days of Exercise per Week: 7 days    Minutes of Exercise per Session: 20 min  Stress: No Stress Concern Present (09/04/2023)   [employer] of Occupational Health - Occupational Stress Questionnaire    Feeling of Stress : Not at all  Social Connections: Socially Integrated (09/04/2023)   Social Connection and Isolation Panel [NHANES]    Frequency of Communication with Friends and Family: More than three times a week    Frequency of Social Gatherings with Friends and Family: Three times a week    Attends Religious Services: 1 to 4 times per year     Active Member of Clubs or Organizations: Yes    Attends Banker Meetings: Never    Marital Status: Married    Tobacco Counseling Counseling given: Not Answered    Clinical Intake:  Pre-visit preparation completed: Yes  Pain : No/denies pain     BMI - recorded: 37.7 Nutritional Status: BMI > 30  Obese Nutritional Risks: None Diabetes: No  Lab Results  Component Value Date   HGBA1C 6.3 05/07/2023   HGBA1C 6.2 06/26/2022   HGBA1C 6.1 06/20/2021     How often do you need to have someone help you when you read instructions, pamphlets, or other written materials from your doctor or pharmacy?: 1 - Never What is the last grade level you completed in school?: 12th grade  Interpreter Needed?: No  Information entered by :: Juliann Ochoa   Activities of Daily Living     09/04/2023    8:19 AM  In your present  state of health, do you have any difficulty performing the following activities:  Hearing? 0  Vision? 0  Difficulty concentrating or making decisions? 0  Walking or climbing stairs? 0  Dressing or bathing? 0  Doing errands, shopping? 0  Preparing Food and eating ? N  Using the Toilet? N  In the past six months, have you accidently leaked urine? N  Do you have problems with loss of bowel control? N  Managing your Medications? N  Managing your Finances? N  Housekeeping or managing your Housekeeping? N    Patient Care Team: Copland, Skipper Dumas, MD as PCP - General (Family Medicine)  Indicate any recent Medical Services you may have received from other than [facility] providers in the past year (date may be approximate).     Assessment:    This is a routine wellness examination for William Leach.  Hearing/Vision screen Hearing Screening - Comments:: Patient has no hearing difficulties  Vision Screening - Comments:: Only wears readers.    Goals Addressed             This Visit's Progress    Patient Stated       To enjoy life        Depression Screen     09/04/2023    8:21 AM 05/07/2023    8:16 AM 06/26/2022    8:28 AM 06/20/2021    2:06 PM 11/01/2020    8:55 AM 04/27/2019    3:40 PM 06/30/2017    3:36 PM  PHQ 2/9 Scores  PHQ - 2 Score 0 0 0 0 0 0 0  PHQ- 9 Score 0          Fall Risk     09/04/2023    8:20 AM 05/07/2023    8:16 AM 06/26/2022    8:27 AM 06/20/2021    2:06 PM 11/01/2020    8:54 AM  Fall Risk   Falls in the past year? 0 0 0 0 0  Number falls in past yr: 0 0 0 0 0  Injury with Fall? 0 0 0 0 0  Risk for fall due to : No Fall Risks No Fall Risks No Fall Risks No Fall Risks No Fall Risks  Follow up Falls evaluation completed Falls evaluation completed Falls evaluation completed Falls evaluation completed Falls evaluation completed    MEDICARE RISK AT HOME:  Medicare Risk at Home Any stairs in or around the home?: Yes If so, are there any without handrails?: No Home free of loose throw rugs in walkways, pet beds, electrical cords, etc?: Yes Adequate lighting in your home to reduce risk of falls?: Yes Life alert?: No Use of a cane, walker or w/c?: No Grab bars in the bathroom?: No Shower chair or bench in shower?: No Elevated toilet seat or a handicapped toilet?: No  TIMED UP AND GO:no  Was the test performed?  No  Cognitive Function: 6CIT completed        09/04/2023    8:18 AM  6CIT Screen  What Year? 0 points  What month? 0 points  What time? 0 points  Count back from 20 0 points  Months in reverse 0 points  Repeat phrase 0 points  Total Score 0 points    Immunizations Immunization History  Administered Date(s) Administered   Pneumococcal Conjugate-13 05/03/2020   Pneumococcal Polysaccharide-23 12/08/2012   Td 06/20/2021   Tdap 03/09/2011    Screening Tests Health Maintenance  Topic Date Due   Zoster Vaccines- Shingrix (1 of  2) Never done   INFLUENZA VACCINE  11/07/2023   Medicare Annual Wellness (AWV)  09/03/2024   Pneumonia Vaccine 1+ Years old (3 of 3 - PCV20 or  PCV21) 05/03/2025   Colonoscopy  03/12/2026   DTaP/Tdap/Td (3 - Td or Tdap) 06/21/2031   Hepatitis C Screening  Completed   HPV VACCINES  Aged Out   Meningococcal B Vaccine  Aged Out   COVID-19 Vaccine  Discontinued    Health Maintenance  Health Maintenance Due  Topic Date Due   Zoster Vaccines- Shingrix (1 of 2) Never done   Health Maintenance Items Addressed:declined shingles   Additional Screening:  Vision Screening: Recommended annual ophthalmology exams for early detection of glaucoma and other disorders of the eye.  Dental Screening: Recommended annual dental exams for proper oral hygiene  Community Resource Referral / Chronic Care Management: CRR required this visit?  No   CCM required this visit?  No   Plan:    I have personally reviewed and noted the following in the patient's chart:   Medical and social history Use of alcohol, tobacco or illicit drugs  Current medications and supplements including opioid prescriptions. Patient is not currently taking opioid prescriptions. Functional ability and status Nutritional status Physical activity Advanced directives List of other physicians Hospitalizations, surgeries, and ER visits in previous 12 months Vitals Screenings to include cognitive, depression, and falls Referrals and appointments  In addition, I have reviewed and discussed with patient certain preventive protocols, quality metrics, and best practice recommendations. A written personalized care plan for preventive services as well as general preventive health recommendations were provided to patient.   Freeda Jerry, [place]   09/04/2023   After Visit Summary: (MyChart) Due to this being a telephonic visit, the after visit summary with patients personalized plan was offered to patient via MyChart   Notes: Nothing significant to report at this time.

## 2023-09-04 NOTE — Patient Instructions (Signed)
 Mr. William Leach , Thank you for taking time out of your busy schedule to complete your Annual Wellness Visit with me. I enjoyed our conversation and look forward to speaking with you again next year. I, as well as your care team,  appreciate your ongoing commitment to your health goals. Please review the following plan we discussed and let me know if I can assist you in the future. Your Game plan/ To Do List    Referrals:None Follow up Visits: Next Medicare AWV with our clinical staff: 09/07/2024   Have you seen your provider in the last 6 months (3 months if uncontrolled diabetes)? Yes Next Office Visit with your provider: 05/10/2024  Clinician Recommendations:  Aim for 30 minutes of exercise or brisk walking, 6-8 glasses of water, and 5 servings of fruits and vegetables each day.       This is a list of the screening recommended for you and due dates:  Health Maintenance  Topic Date Due   Zoster (Shingles) Vaccine (1 of 2) Never done   Flu Shot  11/07/2023   Medicare Annual Wellness Visit  09/03/2024   Pneumonia Vaccine (3 of 3 - PCV20 or PCV21) 05/03/2025   Colon Cancer Screening  03/12/2026   DTaP/Tdap/Td vaccine (3 - Td or Tdap) 06/21/2031   Hepatitis C Screening  Completed   HPV Vaccine  Aged Out   Meningitis B Vaccine  Aged Out   COVID-19 Vaccine  Discontinued    Advanced directives: (Declined) Advance directive discussed with you today. Even though you declined this today, please call our office should you change your mind, and we can give you the proper paperwork for you to fill out. Advance Care Planning is important because it:  [x]  Makes sure you receive the medical care that is consistent with your values, goals, and preferences  [x]  It provides guidance to your family and loved ones and reduces their decisional burden about whether or not they are making the right decisions based on your wishes.  Follow the link provided in your after visit summary or read over the  paperwork we have mailed to you to help you started getting your Advance Directives in place. If you need assistance in completing these, please reach out to us  so that we can help you!  See attachments for Preventive Care and Fall Prevention Tips.

## 2023-09-24 DIAGNOSIS — H401112 Primary open-angle glaucoma, right eye, moderate stage: Secondary | ICD-10-CM | POA: Diagnosis not present

## 2023-10-27 DIAGNOSIS — L570 Actinic keratosis: Secondary | ICD-10-CM | POA: Diagnosis not present

## 2023-10-27 DIAGNOSIS — L538 Other specified erythematous conditions: Secondary | ICD-10-CM | POA: Diagnosis not present

## 2023-10-27 DIAGNOSIS — L820 Inflamed seborrheic keratosis: Secondary | ICD-10-CM | POA: Diagnosis not present

## 2023-10-27 DIAGNOSIS — L281 Prurigo nodularis: Secondary | ICD-10-CM | POA: Diagnosis not present

## 2023-12-01 DIAGNOSIS — R519 Headache, unspecified: Secondary | ICD-10-CM | POA: Diagnosis not present

## 2023-12-01 DIAGNOSIS — R509 Fever, unspecified: Secondary | ICD-10-CM | POA: Diagnosis not present

## 2023-12-01 DIAGNOSIS — R0981 Nasal congestion: Secondary | ICD-10-CM | POA: Diagnosis not present

## 2023-12-01 DIAGNOSIS — K591 Functional diarrhea: Secondary | ICD-10-CM | POA: Diagnosis not present

## 2023-12-01 DIAGNOSIS — R051 Acute cough: Secondary | ICD-10-CM | POA: Diagnosis not present

## 2023-12-04 DIAGNOSIS — H524 Presbyopia: Secondary | ICD-10-CM | POA: Diagnosis not present

## 2024-02-08 ENCOUNTER — Other Ambulatory Visit: Payer: Self-pay | Admitting: Family Medicine

## 2024-02-08 DIAGNOSIS — N529 Male erectile dysfunction, unspecified: Secondary | ICD-10-CM

## 2024-03-19 DIAGNOSIS — H401112 Primary open-angle glaucoma, right eye, moderate stage: Secondary | ICD-10-CM | POA: Diagnosis not present

## 2024-05-10 ENCOUNTER — Encounter: Admitting: Family Medicine

## 2024-05-10 ENCOUNTER — Encounter: Payer: Medicare Other | Admitting: Family Medicine

## 2024-05-10 NOTE — Patient Instructions (Incomplete)
 Good to see you today- I will be in touch with your labs Please see me in about 6 months assuming all is well  Try taking the rosuvastatin  every 3 days; if that works try every other day.  If you have aches even on every 3 days let me know and I will try a less potent drug If your upcoming lifeline screen does not show your aortic root I will order a CT scan to check on this for you  Recommend shingles vaccine series at your pharmacy and seasonal flu shot if not done yet this flu season

## 2024-05-13 ENCOUNTER — Ambulatory Visit: Admitting: Family Medicine

## 2024-05-13 ENCOUNTER — Encounter: Payer: Self-pay | Admitting: Family Medicine

## 2024-05-13 VITALS — BP 123/77 | HR 57 | Temp 98.4°F | Resp 16 | Ht 72.0 in | Wt 288.8 lb

## 2024-05-13 DIAGNOSIS — N529 Male erectile dysfunction, unspecified: Secondary | ICD-10-CM

## 2024-05-13 DIAGNOSIS — E785 Hyperlipidemia, unspecified: Secondary | ICD-10-CM

## 2024-05-13 DIAGNOSIS — I10 Essential (primary) hypertension: Secondary | ICD-10-CM

## 2024-05-13 DIAGNOSIS — Z125 Encounter for screening for malignant neoplasm of prostate: Secondary | ICD-10-CM

## 2024-05-13 DIAGNOSIS — R7303 Prediabetes: Secondary | ICD-10-CM

## 2024-05-13 DIAGNOSIS — Z0000 Encounter for general adult medical examination without abnormal findings: Secondary | ICD-10-CM

## 2024-05-13 LAB — HEMOGLOBIN A1C: Hgb A1c MFr Bld: 6.4 % (ref 4.6–6.5)

## 2024-05-13 LAB — LIPID PANEL
Cholesterol: 174 mg/dL (ref 28–200)
HDL: 37.8 mg/dL — ABNORMAL LOW
LDL Cholesterol: 112 mg/dL — ABNORMAL HIGH (ref 10–99)
NonHDL: 136.07
Total CHOL/HDL Ratio: 5
Triglycerides: 118 mg/dL (ref 10.0–149.0)
VLDL: 23.6 mg/dL (ref 0.0–40.0)

## 2024-05-13 LAB — COMPREHENSIVE METABOLIC PANEL WITH GFR
ALT: 21 U/L (ref 3–53)
AST: 21 U/L (ref 5–37)
Albumin: 4.4 g/dL (ref 3.5–5.2)
Alkaline Phosphatase: 36 U/L — ABNORMAL LOW (ref 39–117)
BUN: 12 mg/dL (ref 6–23)
CO2: 31 meq/L (ref 19–32)
Calcium: 9 mg/dL (ref 8.4–10.5)
Chloride: 103 meq/L (ref 96–112)
Creatinine, Ser: 0.74 mg/dL (ref 0.40–1.50)
GFR: 92.75 mL/min
Glucose, Bld: 94 mg/dL (ref 70–99)
Potassium: 4.1 meq/L (ref 3.5–5.1)
Sodium: 139 meq/L (ref 135–145)
Total Bilirubin: 0.6 mg/dL (ref 0.2–1.2)
Total Protein: 7 g/dL (ref 6.0–8.3)

## 2024-05-13 LAB — CBC
HCT: 40.1 % (ref 39.0–52.0)
Hemoglobin: 13.7 g/dL (ref 13.0–17.0)
MCHC: 34.2 g/dL (ref 30.0–36.0)
MCV: 96.1 fl (ref 78.0–100.0)
Platelets: 250 10*3/uL (ref 150.0–400.0)
RBC: 4.17 Mil/uL — ABNORMAL LOW (ref 4.22–5.81)
RDW: 12.7 % (ref 11.5–15.5)
WBC: 6.2 10*3/uL (ref 4.0–10.5)

## 2024-05-13 LAB — PSA: PSA: 0.42 ng/mL (ref 0.10–4.00)

## 2024-05-13 MED ORDER — SILDENAFIL CITRATE 20 MG PO TABS: ORAL_TABLET | ORAL | 3 refills | Status: AC

## 2024-05-13 MED ORDER — LOSARTAN POTASSIUM 50 MG PO TABS: ORAL_TABLET | ORAL | 3 refills | Status: AC

## 2024-09-07 ENCOUNTER — Ambulatory Visit

## 2025-05-16 ENCOUNTER — Encounter: Admitting: Family Medicine
